# Patient Record
Sex: Male | Born: 1965 | Race: Black or African American | Hispanic: No | Marital: Single | Smoking: Never smoker
Health system: Southern US, Community
[De-identification: ages and names within clinical notes are randomized; demographics above are authoritative.]

## PROBLEM LIST (undated history)

## (undated) DIAGNOSIS — F79 Unspecified intellectual disabilities: Secondary | ICD-10-CM

## (undated) DIAGNOSIS — I10 Essential (primary) hypertension: Secondary | ICD-10-CM

---

## 1997-09-05 ENCOUNTER — Encounter: Admission: RE | Admit: 1997-09-05 | Discharge: 1997-09-05 | Payer: Self-pay | Admitting: Sports Medicine

## 1998-03-11 ENCOUNTER — Encounter: Admission: RE | Admit: 1998-03-11 | Discharge: 1998-03-11 | Payer: Self-pay | Admitting: Family Medicine

## 1998-03-26 ENCOUNTER — Encounter: Admission: RE | Admit: 1998-03-26 | Discharge: 1998-03-26 | Payer: Self-pay | Admitting: Family Medicine

## 1998-04-25 ENCOUNTER — Encounter: Admission: RE | Admit: 1998-04-25 | Discharge: 1998-04-25 | Payer: Self-pay | Admitting: Family Medicine

## 1999-04-20 ENCOUNTER — Encounter: Admission: RE | Admit: 1999-04-20 | Discharge: 1999-04-20 | Payer: Self-pay | Admitting: Family Medicine

## 1999-04-20 ENCOUNTER — Encounter: Admission: RE | Admit: 1999-04-20 | Discharge: 1999-04-20 | Payer: Self-pay | Admitting: *Deleted

## 1999-04-20 ENCOUNTER — Encounter: Payer: Self-pay | Admitting: *Deleted

## 1999-05-19 ENCOUNTER — Encounter: Admission: RE | Admit: 1999-05-19 | Discharge: 1999-05-19 | Payer: Self-pay | Admitting: Sports Medicine

## 1999-06-18 ENCOUNTER — Encounter: Admission: RE | Admit: 1999-06-18 | Discharge: 1999-06-18 | Payer: Self-pay | Admitting: Family Medicine

## 2000-02-09 ENCOUNTER — Encounter: Admission: RE | Admit: 2000-02-09 | Discharge: 2000-02-09 | Payer: Self-pay | Admitting: Family Medicine

## 2000-09-07 ENCOUNTER — Encounter: Admission: RE | Admit: 2000-09-07 | Discharge: 2000-09-07 | Payer: Self-pay | Admitting: Family Medicine

## 2001-08-24 ENCOUNTER — Encounter: Admission: RE | Admit: 2001-08-24 | Discharge: 2001-08-24 | Payer: Self-pay | Admitting: Sports Medicine

## 2001-10-25 ENCOUNTER — Encounter: Admission: RE | Admit: 2001-10-25 | Discharge: 2001-10-25 | Payer: Self-pay | Admitting: Family Medicine

## 2002-05-24 ENCOUNTER — Encounter: Admission: RE | Admit: 2002-05-24 | Discharge: 2002-05-24 | Payer: Self-pay | Admitting: Family Medicine

## 2002-06-01 ENCOUNTER — Encounter: Admission: RE | Admit: 2002-06-01 | Discharge: 2002-06-01 | Payer: Self-pay | Admitting: Family Medicine

## 2002-06-13 ENCOUNTER — Encounter: Admission: RE | Admit: 2002-06-13 | Discharge: 2002-06-13 | Payer: Self-pay | Admitting: Sports Medicine

## 2002-06-13 ENCOUNTER — Encounter: Payer: Self-pay | Admitting: Sports Medicine

## 2002-07-02 ENCOUNTER — Encounter: Admission: RE | Admit: 2002-07-02 | Discharge: 2002-07-02 | Payer: Self-pay | Admitting: Family Medicine

## 2002-11-22 ENCOUNTER — Encounter: Admission: RE | Admit: 2002-11-22 | Discharge: 2002-11-22 | Payer: Self-pay | Admitting: Family Medicine

## 2003-03-25 ENCOUNTER — Encounter: Admission: RE | Admit: 2003-03-25 | Discharge: 2003-03-25 | Payer: Self-pay | Admitting: Family Medicine

## 2003-09-24 ENCOUNTER — Encounter: Admission: RE | Admit: 2003-09-24 | Discharge: 2003-09-24 | Payer: Self-pay | Admitting: Family Medicine

## 2004-09-08 ENCOUNTER — Ambulatory Visit: Payer: Self-pay | Admitting: Family Medicine

## 2005-03-31 ENCOUNTER — Ambulatory Visit: Payer: Self-pay | Admitting: Family Medicine

## 2005-07-21 ENCOUNTER — Ambulatory Visit: Payer: Self-pay | Admitting: Family Medicine

## 2005-07-23 ENCOUNTER — Ambulatory Visit: Payer: Self-pay | Admitting: Family Medicine

## 2006-07-07 DIAGNOSIS — I10 Essential (primary) hypertension: Secondary | ICD-10-CM | POA: Insufficient documentation

## 2006-07-07 DIAGNOSIS — F79 Unspecified intellectual disabilities: Secondary | ICD-10-CM | POA: Insufficient documentation

## 2006-07-07 DIAGNOSIS — G44209 Tension-type headache, unspecified, not intractable: Secondary | ICD-10-CM

## 2006-07-27 ENCOUNTER — Ambulatory Visit: Payer: Self-pay | Admitting: Family Medicine

## 2006-07-27 ENCOUNTER — Encounter: Payer: Self-pay | Admitting: Family Medicine

## 2006-07-27 LAB — CONVERTED CEMR LAB
ALT: 34 units/L (ref 0–53)
AST: 17 units/L (ref 0–37)
Albumin: 4.3 g/dL (ref 3.5–5.2)
Alkaline Phosphatase: 89 units/L (ref 39–117)
BUN: 13 mg/dL (ref 6–23)
CO2: 25 meq/L (ref 19–32)
Calcium: 9.2 mg/dL (ref 8.4–10.5)
Chloride: 101 meq/L (ref 96–112)
Creatinine, Ser: 1.17 mg/dL (ref 0.40–1.50)
Glucose, Bld: 112 mg/dL — ABNORMAL HIGH (ref 70–99)
Potassium: 3.9 meq/L (ref 3.5–5.3)
Sodium: 139 meq/L (ref 135–145)
Total Bilirubin: 1.5 mg/dL — ABNORMAL HIGH (ref 0.3–1.2)
Total Protein: 8 g/dL (ref 6.0–8.3)

## 2006-12-07 ENCOUNTER — Encounter (INDEPENDENT_AMBULATORY_CARE_PROVIDER_SITE_OTHER): Payer: Self-pay | Admitting: Family Medicine

## 2007-03-05 ENCOUNTER — Emergency Department (HOSPITAL_COMMUNITY): Admission: EM | Admit: 2007-03-05 | Discharge: 2007-03-05 | Payer: Self-pay | Admitting: Emergency Medicine

## 2007-05-19 ENCOUNTER — Ambulatory Visit: Payer: Self-pay | Admitting: Family Medicine

## 2007-05-19 DIAGNOSIS — E669 Obesity, unspecified: Secondary | ICD-10-CM

## 2007-11-21 ENCOUNTER — Ambulatory Visit: Payer: Self-pay | Admitting: Family Medicine

## 2007-11-21 ENCOUNTER — Encounter: Payer: Self-pay | Admitting: Family Medicine

## 2007-11-21 DIAGNOSIS — B351 Tinea unguium: Secondary | ICD-10-CM

## 2007-11-21 LAB — CONVERTED CEMR LAB
ALT: 14 units/L (ref 0–53)
AST: 14 units/L (ref 0–37)
Albumin: 4.3 g/dL (ref 3.5–5.2)
Alkaline Phosphatase: 82 units/L (ref 39–117)
BUN: 16 mg/dL (ref 6–23)
CO2: 26 meq/L (ref 19–32)
Calcium: 9.6 mg/dL (ref 8.4–10.5)
Chloride: 102 meq/L (ref 96–112)
Creatinine, Ser: 1.09 mg/dL (ref 0.40–1.50)
Glucose, Bld: 105 mg/dL — ABNORMAL HIGH (ref 70–99)
Potassium: 3.8 meq/L (ref 3.5–5.3)
Sodium: 142 meq/L (ref 135–145)
Total Bilirubin: 1.3 mg/dL — ABNORMAL HIGH (ref 0.3–1.2)
Total Protein: 7.7 g/dL (ref 6.0–8.3)

## 2008-02-29 ENCOUNTER — Ambulatory Visit: Payer: Self-pay | Admitting: Family Medicine

## 2008-07-11 ENCOUNTER — Ambulatory Visit: Payer: Self-pay | Admitting: Family Medicine

## 2008-07-11 ENCOUNTER — Encounter: Payer: Self-pay | Admitting: Family Medicine

## 2008-07-11 DIAGNOSIS — R32 Unspecified urinary incontinence: Secondary | ICD-10-CM

## 2008-07-11 DIAGNOSIS — R159 Full incontinence of feces: Secondary | ICD-10-CM | POA: Insufficient documentation

## 2008-07-11 LAB — CONVERTED CEMR LAB
Bilirubin Urine: NEGATIVE
Blood in Urine, dipstick: NEGATIVE
Cholesterol: 166 mg/dL (ref 0–200)
Glucose, Urine, Semiquant: NEGATIVE
HDL: 32 mg/dL — ABNORMAL LOW (ref >39)
LDL Cholesterol: 119 mg/dL — ABNORMAL HIGH (ref 0–99)
Nitrite: NEGATIVE
Protein, U semiquant: 30
Specific Gravity, Urine: 1.025
Total CHOL/HDL Ratio: 5.2
Triglycerides: 76 mg/dL (ref <150)
Urobilinogen, UA: 0.2
VLDL: 15 mg/dL (ref 0–40)
WBC Urine, dipstick: NEGATIVE
pH: 6

## 2009-06-10 ENCOUNTER — Ambulatory Visit: Payer: Self-pay | Admitting: Family Medicine

## 2009-06-16 ENCOUNTER — Ambulatory Visit: Payer: Self-pay | Admitting: Family Medicine

## 2009-06-23 ENCOUNTER — Ambulatory Visit: Payer: Self-pay | Admitting: Family Medicine

## 2010-03-18 ENCOUNTER — Emergency Department (HOSPITAL_COMMUNITY): Admission: EM | Admit: 2010-03-18 | Discharge: 2010-03-18 | Payer: Self-pay | Admitting: Emergency Medicine

## 2010-03-20 ENCOUNTER — Encounter: Payer: Self-pay | Admitting: Family Medicine

## 2010-03-20 ENCOUNTER — Ambulatory Visit: Payer: Self-pay | Admitting: Family Medicine

## 2010-03-20 DIAGNOSIS — S8990XA Unspecified injury of unspecified lower leg, initial encounter: Secondary | ICD-10-CM | POA: Insufficient documentation

## 2010-03-20 DIAGNOSIS — S99919A Unspecified injury of unspecified ankle, initial encounter: Secondary | ICD-10-CM

## 2010-03-20 DIAGNOSIS — S99929A Unspecified injury of unspecified foot, initial encounter: Secondary | ICD-10-CM

## 2010-03-23 LAB — CONVERTED CEMR LAB
BUN: 10 mg/dL (ref 6–23)
CO2: 28 meq/L (ref 19–32)
Calcium: 8.7 mg/dL (ref 8.4–10.5)
Chloride: 105 meq/L (ref 96–112)
Creatinine, Ser: 0.99 mg/dL (ref 0.40–1.50)
Glucose, Bld: 132 mg/dL — ABNORMAL HIGH (ref 70–99)
Potassium: 3.7 meq/L (ref 3.5–5.3)
Sodium: 143 meq/L (ref 135–145)

## 2010-04-30 ENCOUNTER — Encounter: Payer: Self-pay | Admitting: Family Medicine

## 2010-06-04 ENCOUNTER — Telehealth: Payer: Self-pay | Admitting: Family Medicine

## 2010-06-06 ENCOUNTER — Encounter: Payer: Self-pay | Admitting: Family Medicine

## 2010-06-11 NOTE — Assessment & Plan Note (Signed)
Summary: BP CHECK/KH  Nurse Visit  In for BP check. BP checked  manually using large adult cuff in left arm. BP 120/90 pulse 92. spoke with caregiver , Erie Noe , and she verifies that patient has been taking medication as directed. advised to return in one week to recheck BP again as MD had wanted him to come back in 2 weeks after last office visit. she voices understanding. will forward reading today to MD and ask if any different instructions at this time. call back for Erie Noe [phone]. Theresia Lo RN  June 16, 2009 2:45 PM   Allergies: No Known Drug Allergies  Orders Added: 1)  No Charge Patient Arrived (NCPA0) [NCPA0] due to maternity leave Dr. Georgiana Shore did not get this message . patient returned on 06/23/2009 for BP check . see note. consulted with Dr. Deirdre Priest. Theresia Lo RN  June 23, 2009 11:07 AM

## 2010-06-11 NOTE — Assessment & Plan Note (Signed)
Summary: bp check/eo  Nurse Visit In for BP check today. BP LA120/100 , RA 120/104, pulse 92. caregiver states patient has not had BP med today yet.  caregiver was rushing to get here with another patient and brought [place] with her but did forget to give med today. Consulted Dr. Deirdre Priest and he advises to have patient return in one week again and be sure to take med  at usual time before BP check. Caregiver will scheduled appointment.  Allergies: No Known Drug Allergies  Orders Added: 1)  No Charge Patient Arrived (NCPA0) [NCPA0]
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
## 2010-06-11 NOTE — Miscellaneous (Signed)
  Clinical Lists Changes  Problems: Removed problem of HEALTH MAINTENANCE EXAM (ICD-V70.0) Removed problem of SCREENING FOR LIPOID DISORDERS (ICD-V77.91) Removed problem of HYPERTENSION, BENIGN ESSENTIAL (ICD-401.1)

## 2010-06-11 NOTE — Assessment & Plan Note (Signed)
Summary: congestion,tcb   Vital Signs:  Patient profile:   45 year old male Height:      69 inches Weight:      195.6 pounds BMI:     28.99 Temp:     98.8 degrees F oral Pulse rate:   80 / minute BP sitting:   121 / 84  (left arm) Cuff size:   regular  Vitals Entered By: Gladstone Pih (June 10, 2009 10:04 AM) CC: bp Is Patient Diabetic? No Pain Assessment Patient in pain? no        Primary Care Provider:  Ardeen Garland  MD  CC:  bp.  History of Present Illness: Here for routine check-up follow up.  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  needs form filled out.  Discussed:  1.  bp--slightly elevated diastolic bp at most of visits in recent past.  today on manual recheck, dbp is 92.  has been as high as 100.  on metop 50 two times a day and hctz 25.    Habits & Providers  Alcohol-Tobacco-Diet     Tobacco Status: never  Current Medications (verified): 1)  Hydrochlorothiazide 25 Mg  Tabs (Hydrochlorothiazide) .Marland Kitchen.. 1 Tablet Daily For High Blood Pressure 2)  Metoprolol Tartrate 25 Mg Tabs (Metoprolol Tartrate) .Marland Kitchen.. 1 Tab By Mouth Two Times A Day  Allergies: No Known Drug Allergies  Past History:  Past Medical History:  microscopic hematuria (assoc w/ renal stone), renal stone, Walks with limp HTN mild-moderate MR  Past Surgical History: Reviewed history from 07/07/2006 and no changes required. CT - Head   normal - 04/10/1999, xray - abdomen: 5mm right renal stone - 06/18/2002  Family History: Reviewed history from 05/19/2007 and no changes required. Patient is mentally retarded, and his family history is unavailable. His case manager in group home has been queried about any additional history.  Social History: Reviewed history from 07/11/2008 and no changes required. Denies EtoH, tob, drugs.  Works at [employer].  Resident of group home.  Sister is legal guardian and manages his finances.  Review of  Systems  The patient denies chest pain.   CV:  Denies shortness of breath with exertion and swelling of feet.  Physical Exam  General:  MR middle aged male pleasant, answers questions appropriately, follows commands appropriately. Lungs:  Normal respiratory effort, chest expands symmetrically. Lungs are clear to auscultation, no crackles or wheezes. Heart:  Normal rate and regular rhythm. S1 and S2 normal without gallop, murmur, click, rub or other extra sounds. PMI normal.   Extremities:  no LE edema Additional Exam:  vital signs reviewed    Impression & Recommendations:  Problem # 1:  HYPERTENSION, BENIGN SYSTEMIC (ICD-401.1) Assessment Unchanged  check labs.  he is young man, and would like to see dbp a little lower.  increase metop.  nurse visit for bp recheck in 2 weeks.  check bmet today His updated medication list for this problem includes:    Hydrochlorothiazide 25 Mg Tabs (Hydrochlorothiazide) .Marland Kitchen... 1 tablet daily for high blood pressure    Metoprolol Tartrate 100 Mg Tabs (Metoprolol tartrate) .Marland Kitchen... 1 tab by mouth two times a day for blood pressure  Orders: [facility] ([address])  Complete Medication List: 1)  Hydrochlorothiazide 25 Mg Tabs (Hydrochlorothiazide) .Marland Kitchen.. 1 tablet daily for high blood pressure 2)  Metoprolol Tartrate 100 Mg Tabs (Metoprolol tartrate) .Marland Kitchen.. 1 tab by mouth two times a day for blood pressure  Other Orders:  [facility]- Est Level  3 ([address])   Patient Instructions: 1)  It was nice to see you today. 2)  I increased your metoprolol for blood pressure.  3)  Make an appointment for a  nurse visit for a blood pressure check in the next 3 weeks. 4)  Please schedule a follow-up appointment in 1 year.  Prescriptions: METOPROLOL TARTRATE 100 MG TABS (METOPROLOL TARTRATE) 1 tab by mouth two times a day for blood pressure  #60 x 6   Entered by:   Asher Muir MD   Authorized by:   Marisue Ivan  MD   Signed by:   Asher Muir MD on  06/10/2009   Method used:   Electronically to        [facility]* (retail)       [address]       [place], [place]  [id number]       Ph: [phone]       Fax: [phone]   RxID:   [id number]   Prevention & Chronic Care Immunizations   Influenza vaccine: Fluvax 3+  (05/19/2007)   Influenza vaccine due: 05/18/2008    Tetanus booster: 07/11/2008: given   Tetanus booster due: 07/12/2018    Pneumococcal vaccine: Not documented  Other Screening   Smoking status: never  (06/10/2009)  Lipids   Total Cholesterol: 166  (07/11/2008)   LDL: 119  (07/11/2008)   LDL Direct: Not documented   HDL: 32  (07/11/2008)   Triglycerides: 76  (07/11/2008)  Hypertension   Last Blood Pressure: 121 / 84  (06/10/2009)   Serum creatinine: 1.09  (11/21/2007)   Serum potassium 3.8  (11/21/2007)    Hypertension flowsheet reviewed?: Yes   Progress toward BP goal: At goal  Self-Management Support :   Personal Goals (by the next clinic visit) :      Personal blood pressure goal: 140/90  (06/10/2009)   Hypertension self-management support: Not documented

## 2010-06-11 NOTE — Letter (Signed)
Summary: Generic Letter  [facility]  [address]   [place], [place] [address]   Phone: [phone]  Fax: [phone]    06/06/2010  [name] [address] [place], [place]  [address]  To whom it may concern,  Please discontinue any narcotic medication that Brendan Abbott is using. Including hydrocodone/acetominaphen. If there are any questions, please call the [facility].           Sincerely,   Doree Albee MD

## 2010-06-11 NOTE — Progress Notes (Signed)
Summary: Medication Letter  Phone Note Other Incoming Call back at [phone]   Caller: [facility] Summary of Call: Need order to discontinue taking Hydrocodone 5-325 mgs.   Initial call taken by: Abundio Miu,  June 04, 2010 2:51 PM  Follow-up for Phone Call        Recieved call from cargiver stating that pt recieved short term course of vicodin from [facility]. 9-10/11. Was seen at followup in [facility] 03/2010. Pt has completed course of medication. Caregiver is asking for formal letter tating to discontinue vicodin. Discussed with caregiver that I never filled vicodin. Pt has completed medication. Has no refills. Caregiver states that state law requires written letter  that medication os discontiued. Told caregiver that while have not filled rx, I will give letter about medication. Caregiver agreeable.  Doree Albee MD June 06, 2010 7:24 PM

## 2010-07-02 ENCOUNTER — Other Ambulatory Visit: Payer: Self-pay | Admitting: Family Medicine

## 2010-07-02 MED ORDER — METOPROLOL TARTRATE 100 MG PO TABS: 100.0000 mg | ORAL_TABLET | Freq: Two times a day (BID) | ORAL | Status: DC

## 2010-07-08 ENCOUNTER — Ambulatory Visit (INDEPENDENT_AMBULATORY_CARE_PROVIDER_SITE_OTHER): Payer: Medicare Other | Admitting: Family Medicine

## 2010-07-08 ENCOUNTER — Encounter: Payer: Self-pay | Admitting: Family Medicine

## 2010-07-08 VITALS — BP 131/80 | HR 88 | Temp 98.2°F | Ht 68.25 in | Wt 182.2 lb

## 2010-07-08 DIAGNOSIS — E669 Obesity, unspecified: Secondary | ICD-10-CM

## 2010-07-08 DIAGNOSIS — I10 Essential (primary) hypertension: Secondary | ICD-10-CM

## 2010-07-08 MED ORDER — HYDROCHLOROTHIAZIDE 25 MG PO TABS: 25.0000 mg | ORAL_TABLET | Freq: Every day | ORAL | Status: DC

## 2010-07-08 MED ORDER — LISINOPRIL 10 MG PO TABS: 10.0000 mg | ORAL_TABLET | Freq: Every day | ORAL | Status: DC

## 2010-07-08 MED ORDER — METOPROLOL TARTRATE 100 MG PO TABS: ORAL_TABLET | ORAL | Status: DC

## 2010-07-08 MED ORDER — ASPIRIN 81 MG PO CHEW: 81.0000 mg | CHEWABLE_TABLET | Freq: Every day | ORAL | Status: AC

## 2010-07-08 NOTE — Patient Instructions (Addendum)
Hypertension (High Blood Pressure)   As your heart beats, it forces blood through your arteries. This force is your blood pressure. If the pressure is too high, it is called hypertension (HTN) or high blood pressure. HTN is dangerous because you may have it and not know it. High blood pressure may mean that your heart has to work harder to pump blood. Your arteries may be narrow or stiff. The extra work puts you at risk for heart disease, stroke, and other problems.    Blood pressure consists of two numbers, a higher number over a lower, 110/72, for example. It is stated as "110 over 72." The ideal is below 120 for the top number (systolic) and under 80 for the bottom (diastolic).   You should pay close attention to your blood pressure if you have certain conditions such as:  Heart failure.  Prior heart attack.   Diabetes  Chronic kidney disease.   Prior stroke.  Multiple risk factors for heart disease.    To see if you have HTN, your blood pressure should be measured while you are seated with your arm held at the level of the heart. It should be measured at least twice. A one-time elevated blood pressure reading (especially in the Emergency Department) does not mean that you need treatment. There may be conditions in which the blood pressure is different between your right and left arms. It is important to see your caregiver soon for a recheck.   Most people have essential hypertension which means that there is not a specific cause. This type of high blood pressure may be lowered by changing lifestyle factors such as:  Stress.  Smoking.  Lack of exercise.  Excessive weight.  Drug/tobacco/alcohol use.   Eating less salt.     Most people do not have symptoms from high blood pressure until it has caused damage to the body. Effective treatment can often prevent, delay or reduce that damage.   TREATMENT: Treatment for high blood pressure, when a cause has been identified, is directed  at the cause. There are a large number of medications to treat HTN. These fall into several categories, and your caregiver will help you select the medicines that are best for you. Medications may have side effects. You should review side effects with your caregiver.   If your blood pressure stays high after you have made lifestyle changes or started on medicines,   Your medication(s) may need to be changed.  Other problems may need to be addressed.  Be certain you understand your prescriptions, and know how and when to take your medicine.  Be sure to follow up with your caregiver within the time frame advised (usually within two weeks) to have your blood pressure rechecked and to review your medications.  If you are taking more than one medicine to lower your blood pressure, make sure you know how and at what times they should be taken. Taking two medicines at the same time can result in blood pressure that is too low.   SEEK IMMEDIATE MEDICAL CARE IF YOU DEVELOP:  A severe headache, blurred or changing vision, or confusion.  Unusual weakness or numbness, or a faint feeling.  Severe chest or abdominal pain, vomiting, or breathing problems.   It is your responsibility to follow up with your caregiver in order to determine if your elevated blood pressure should be treated.     MAKE SURE YOU:   Understand these instructions.   Will watch your condition.  Will get help right away if you are not doing well or get worse.   Document Released: 10/07/2005  Document Re-Released: 07/23/2008 [facility] Patient Information 2011 [place], [place]. New Doctor's Orders: Start taking aspirin 81mg  daily Take 1 1/2 tablets of metoprolol 100mg  twice daily Continue with you previous medication dosing Take blood pressure once-twice daily Doree Albee MD

## 2010-07-09 ENCOUNTER — Other Ambulatory Visit: Payer: Medicare Other

## 2010-07-13 NOTE — Progress Notes (Signed)
  Subjective:    Patient ID: LINFORD QUINTELA, male    DOB: [birth year], 45 y.o.   MRN: [id number]  Hypertension This is a chronic problem. The current episode started more than 1 year ago. The problem is unchanged. The problem is controlled. Pertinent negatives include no blurred vision, chest pain, headaches, palpitations or peripheral edema. There are no associated agents to hypertension. Risk factors for coronary artery disease include male gender. Past treatments include beta blockers and diuretics. Compliance problems include diet (Caregiver reports patient eating lower sodium diet as pt  is being fed stricter diet).  Hypertensive end-organ damage includes CAD/MI.      Review of Systems  Eyes: Negative for blurred vision.  Cardiovascular: Negative for chest pain and palpitations.  Neurological: Negative for headaches.       Objective:   Physical Exam  Constitutional: He appears well-developed.  HENT:  Head: Normocephalic and atraumatic.  Eyes: Conjunctivae are normal. Pupils are equal, round, and reactive to light. No scleral icterus.  Neck: Normal range of motion. Neck supple.  Cardiovascular: Normal rate, regular rhythm and normal heart sounds.   Pulmonary/Chest: Effort normal and breath sounds normal.  Abdominal: Soft. Bowel sounds are normal.  Neurological:       Responsive to some commands. Baseline MR stable  Psychiatric:       Mood, affect, behavior at baseline          Assessment & Plan:

## 2010-07-13 NOTE — Assessment & Plan Note (Addendum)
Weight trending down with physical activity. BMI now 27.5. Will continue to follow.

## 2010-07-13 NOTE — Assessment & Plan Note (Addendum)
Otherwise normal follow up. Will increase metoprolol by 50% given baseline HR in upper 80s. Will otherwise follow up  In 6 months. Encouraged continued exercise at living facility as well as low sodium diet. Plan to also check lipids today. Caregiver agreeable.

## 2010-07-31 ENCOUNTER — Ambulatory Visit: Payer: Self-pay | Admitting: Family Medicine

## 2010-08-04 ENCOUNTER — Other Ambulatory Visit: Payer: Medicare Other

## 2010-08-04 DIAGNOSIS — I10 Essential (primary) hypertension: Secondary | ICD-10-CM

## 2010-08-04 LAB — COMPREHENSIVE METABOLIC PANEL
ALT: <8 U/L (ref 0–53)
AST: 12 U/L (ref 0–37)
Calcium: 9.5 mg/dL (ref 8.4–10.5)
Chloride: 102 mEq/L (ref 96–112)
Creat: 1.07 mg/dL (ref 0.40–1.50)
Sodium: 141 mEq/L (ref 135–145)
Total Bilirubin: 1.6 mg/dL — ABNORMAL HIGH (ref 0.3–1.2)
Total Protein: 7.6 g/dL (ref 6.0–8.3)

## 2010-08-04 LAB — LIPID PANEL
LDL Cholesterol: 81 mg/dL (ref 0–99)
Triglycerides: 137 mg/dL (ref <150)
VLDL: 27 mg/dL (ref 0–40)

## 2010-08-04 NOTE — Progress Notes (Signed)
Drew pt for CMP and fasting lipid. 1sst. AC

## 2010-08-10 ENCOUNTER — Ambulatory Visit: Payer: Medicare Other | Admitting: Family Medicine

## 2010-08-10 ENCOUNTER — Encounter: Payer: Self-pay | Admitting: Family Medicine

## 2010-08-10 ENCOUNTER — Ambulatory Visit (INDEPENDENT_AMBULATORY_CARE_PROVIDER_SITE_OTHER): Payer: Medicare Other | Admitting: Family Medicine

## 2010-08-10 VITALS — BP 117/82 | HR 74 | Temp 98.1°F | Wt 184.0 lb

## 2010-08-10 DIAGNOSIS — I10 Essential (primary) hypertension: Secondary | ICD-10-CM

## 2010-08-17 NOTE — Progress Notes (Signed)
Pt scheduled for follow up appt in error. Will follow up in 6 months as previously scheduled.  Subjective:    Patient ID: Brendan Abbott, male    DOB: 04-07-1966, 45 y.o.   MRN: [id number]  HPI    Review of Systems     Objective:   Physical Exam        Assessment & Plan:  Pt scheduled for follow up appt in error. Will follow up in 6 months as previously scheduled.

## 2010-10-23 ENCOUNTER — Telehealth: Payer: Self-pay | Admitting: Family Medicine

## 2010-10-23 NOTE — Telephone Encounter (Signed)
Form placed in MD box.

## 2010-10-23 NOTE — Telephone Encounter (Signed)
Patients caregiver dropped off forms to be filled out for new care facility/  Please call her when completed.

## 2011-02-11 ENCOUNTER — Telehealth: Payer: Self-pay | Admitting: Family Medicine

## 2011-02-11 NOTE — Telephone Encounter (Signed)
Patients caregiver dropped off CPS form to be completed, given to D. Loring for any clinical completion.

## 2011-02-11 NOTE — Telephone Encounter (Signed)
PCS form placed in Dr. Roscoe Blas box for completion.  [name]

## 2011-02-15 NOTE — Telephone Encounter (Signed)
PCS form completed.  Jasmine December notified form is ready to be picked up at front desk.  [name]

## 2011-03-02 ENCOUNTER — Telehealth: Payer: Self-pay | Admitting: *Deleted

## 2011-03-02 DIAGNOSIS — I10 Essential (primary) hypertension: Secondary | ICD-10-CM

## 2011-03-02 MED ORDER — METOPROLOL TARTRATE 100 MG PO TABS: ORAL_TABLET | ORAL | Status: DC

## 2011-03-16 ENCOUNTER — Other Ambulatory Visit: Payer: Self-pay | Admitting: Family Medicine

## 2011-03-16 DIAGNOSIS — I10 Essential (primary) hypertension: Secondary | ICD-10-CM

## 2011-03-16 MED ORDER — LISINOPRIL 10 MG PO TABS: 10.0000 mg | ORAL_TABLET | Freq: Every day | ORAL | Status: DC

## 2011-03-16 MED ORDER — HYDROCHLOROTHIAZIDE 25 MG PO TABS: 25.0000 mg | ORAL_TABLET | Freq: Every day | ORAL | Status: DC

## 2011-03-29 ENCOUNTER — Other Ambulatory Visit: Payer: Self-pay | Admitting: Family Medicine

## 2011-03-29 DIAGNOSIS — I10 Essential (primary) hypertension: Secondary | ICD-10-CM

## 2011-03-29 MED ORDER — HYDROCHLOROTHIAZIDE 25 MG PO TABS: 25.0000 mg | ORAL_TABLET | Freq: Every day | ORAL | Status: DC

## 2011-03-29 MED ORDER — METOPROLOL TARTRATE 100 MG PO TABS: ORAL_TABLET | ORAL | Status: DC

## 2011-04-27 ENCOUNTER — Other Ambulatory Visit: Payer: Self-pay | Admitting: Family Medicine

## 2011-04-27 NOTE — Telephone Encounter (Signed)
Refill request

## 2011-06-11 ENCOUNTER — Telehealth: Payer: Self-pay | Admitting: *Deleted

## 2011-06-11 NOTE — Telephone Encounter (Signed)
Caregiver dropped off FL2 form to be completed by MD, please call her when ready to be picked up. 

## 2011-06-11 NOTE — Telephone Encounter (Signed)
FL-2 placed in Dr. Silex Blas box for signature.  Brendan Abbott

## 2011-06-16 NOTE — Telephone Encounter (Signed)
FL-2 and CCME for [facility] forms completed.  Kelli Churn notified forms are ready to be picked up at front desk.  [name]

## 2011-09-13 ENCOUNTER — Other Ambulatory Visit: Payer: Self-pay | Admitting: Family Medicine

## 2011-11-03 ENCOUNTER — Other Ambulatory Visit: Payer: Self-pay | Admitting: Family Medicine

## 2011-11-09 ENCOUNTER — Other Ambulatory Visit: Payer: Self-pay | Admitting: Family Medicine

## 2011-11-22 ENCOUNTER — Encounter: Payer: Self-pay | Admitting: Family Medicine

## 2011-11-22 ENCOUNTER — Ambulatory Visit (INDEPENDENT_AMBULATORY_CARE_PROVIDER_SITE_OTHER): Payer: Medicare Other | Admitting: Family Medicine

## 2011-11-22 VITALS — BP 120/79 | HR 84 | Ht 68.25 in | Wt 198.0 lb

## 2011-11-22 DIAGNOSIS — E669 Obesity, unspecified: Secondary | ICD-10-CM

## 2011-11-22 DIAGNOSIS — F79 Unspecified intellectual disabilities: Secondary | ICD-10-CM

## 2011-11-22 DIAGNOSIS — I10 Essential (primary) hypertension: Secondary | ICD-10-CM

## 2011-11-24 NOTE — Progress Notes (Signed)
  Subjective:    Patient ID: Brendan Abbott, male    DOB: 09-07-1965, 46 y.o.   MRN: [id number]  HPI 1. HTN:  Compliant with medications.  Reports no problems.  Care giver at group home checks BP occasionally.  Denies chest pain, headache, palpitations, lightheadedness.  2. Mental retardation:  Reports no new problems.  Needs paperwork for group home completed and letter for day program.   3. Obesity:  Weight has increased some over the past year.  No changes in diet.  No regular exercise.  Lipid panel in the past has been normal.     Review of Systems Per HPI    Objective:   Physical Exam  Constitutional: No distress.       Quiet, mentally retarded   Cardiovascular: Normal rate and regular rhythm.   Pulmonary/Chest: Effort normal and breath sounds normal.  Musculoskeletal: He exhibits no edema.          Assessment & Plan:

## 2011-11-24 NOTE — Assessment & Plan Note (Signed)
Discussed limiting fats and carbs in diet.  Walking as tolerated for exercise.

## 2011-11-24 NOTE — Assessment & Plan Note (Signed)
Completed paperwork and wrote letter for group home.

## 2011-11-24 NOTE — Assessment & Plan Note (Signed)
Pressures well controlled, continue current medications.

## 2011-12-14 ENCOUNTER — Other Ambulatory Visit: Payer: Self-pay | Admitting: *Deleted

## 2012-01-19 ENCOUNTER — Other Ambulatory Visit: Payer: Self-pay | Admitting: Family Medicine

## 2012-01-19 MED ORDER — LISINOPRIL 10 MG PO TABS: 10.0000 mg | ORAL_TABLET | Freq: Every day | ORAL | Status: DC

## 2012-05-16 ENCOUNTER — Other Ambulatory Visit: Payer: Self-pay | Admitting: *Deleted

## 2012-05-16 MED ORDER — METOPROLOL TARTRATE 100 MG PO TABS: 150.0000 mg | ORAL_TABLET | Freq: Two times a day (BID) | ORAL | Status: DC

## 2012-06-08 ENCOUNTER — Telehealth: Payer: Self-pay | Admitting: Family Medicine

## 2012-06-08 NOTE — Telephone Encounter (Signed)
Caregiver dropped off forms to be filled out.  Please call when completed.

## 2012-06-13 NOTE — Telephone Encounter (Signed)
PCS form completed by Dr. Ashley Royalty. Ms.Liggins notified form is ready to be picked up at front desk.   JAS

## 2012-07-06 ENCOUNTER — Ambulatory Visit (INDEPENDENT_AMBULATORY_CARE_PROVIDER_SITE_OTHER): Payer: Medicare Other | Admitting: Family Medicine

## 2012-07-06 ENCOUNTER — Encounter: Payer: Self-pay | Admitting: Family Medicine

## 2012-07-06 VITALS — BP 118/80 | HR 81 | Ht 68.25 in | Wt 205.0 lb

## 2012-07-06 DIAGNOSIS — E669 Obesity, unspecified: Secondary | ICD-10-CM | POA: Diagnosis not present

## 2012-07-06 DIAGNOSIS — I10 Essential (primary) hypertension: Secondary | ICD-10-CM | POA: Diagnosis not present

## 2012-07-06 LAB — CBC
Hemoglobin: 15.5 g/dL (ref 13.0–17.0)
MCH: 25.1 pg — ABNORMAL LOW (ref 26.0–34.0)
RBC: 6.17 MIL/uL — ABNORMAL HIGH (ref 4.22–5.81)

## 2012-07-06 LAB — COMPREHENSIVE METABOLIC PANEL
Albumin: 4.1 g/dL (ref 3.5–5.2)
Alkaline Phosphatase: 81 U/L (ref 39–117)
BUN: 17 mg/dL (ref 6–23)
Glucose, Bld: 119 mg/dL — ABNORMAL HIGH (ref 70–99)
Potassium: 3.6 mEq/L (ref 3.5–5.3)

## 2012-07-06 LAB — LIPID PANEL
Cholesterol: 152 mg/dL (ref 0–200)
HDL: 26 mg/dL — ABNORMAL LOW (ref >39)
Total CHOL/HDL Ratio: 5.8 Ratio
Triglycerides: 210 mg/dL — ABNORMAL HIGH (ref <150)

## 2012-07-06 NOTE — Patient Instructions (Signed)
Thank you for coming in today, it was good to see you Everything looks great today.   I am checking some lab work, you can expect to hear something in the next week. Follow up with me in 6 months or as needed If you need any paperwork completed please drop off at the front dest.

## 2012-07-07 ENCOUNTER — Other Ambulatory Visit: Payer: Self-pay | Admitting: *Deleted

## 2012-07-07 MED ORDER — HYDROCHLOROTHIAZIDE 25 MG PO TABS: ORAL_TABLET | ORAL | Status: DC

## 2012-07-16 NOTE — Assessment & Plan Note (Signed)
BP well controlled, will check lab work today.  No changes to medications.

## 2012-07-16 NOTE — Progress Notes (Signed)
  Subjective:    Patient ID: Brendan Abbott, male    DOB: Sep 30, 1965, 47 y.o.   MRN: [id number]  HPI Has recently changed to [facility] of Care for home medical services and wanted to have everything checked out.   1. HTN: CHRONIC HYPERTENSION  Disease Monitoring  Blood pressure range: BP has been in good range from [facility] provider, but does not remember specific numbers  Chest pain: no   Dyspnea: no   Claudication: no   Medication compliance: yes  Medication Side Effects  Lightheadedness: no   Urinary frequency: no   Edema: no     2. Obesity:   Walking some for exercise.  Has meals prepared for him and does not do any cooking on his own.  He is not a picky eater and typically eats a variety of foods.    Review of Systems     Objective:   Physical Exam  Constitutional: He appears well-nourished. No distress.  HENT:  Head: Normocephalic and atraumatic.  Cardiovascular: Normal rate and regular rhythm.   Pulmonary/Chest: Effort normal and breath sounds normal.  Musculoskeletal: He exhibits no edema.          Assessment & Plan:

## 2012-07-16 NOTE — Assessment & Plan Note (Signed)
Weight stable.  Walking some for exercise.  Will check lipids today

## 2012-08-09 ENCOUNTER — Telehealth: Payer: Self-pay | Admitting: Family Medicine

## 2012-08-09 NOTE — Telephone Encounter (Signed)
Form dropped off by caregiver to be filled out for personal care.  Please call her when completed.

## 2012-08-09 NOTE — Telephone Encounter (Signed)
PCS form placed in Dr. Anastasio Auerbach box for completion.  [name]

## 2012-08-14 ENCOUNTER — Other Ambulatory Visit: Payer: Self-pay | Admitting: *Deleted

## 2012-08-14 MED ORDER — LISINOPRIL 10 MG PO TABS: 10.0000 mg | ORAL_TABLET | Freq: Every day | ORAL | Status: DC

## 2012-08-15 NOTE — Telephone Encounter (Signed)
Completed and returned to Lavetta Nielsen, DO

## 2012-08-15 NOTE — Telephone Encounter (Signed)
Ms. Brendan Abbott notified that PCS form is completed and ready to be picked up at front desk.  Brendan Abbott

## 2012-08-21 ENCOUNTER — Other Ambulatory Visit: Payer: Self-pay | Admitting: *Deleted

## 2012-08-21 MED ORDER — METOPROLOL TARTRATE 100 MG PO TABS: 150.0000 mg | ORAL_TABLET | Freq: Two times a day (BID) | ORAL | Status: DC

## 2012-08-21 MED ORDER — ASPIRIN 81 MG PO CHEW: 81.0000 mg | CHEWABLE_TABLET | Freq: Every day | ORAL | Status: DC

## 2012-10-16 ENCOUNTER — Ambulatory Visit: Payer: Medicare Other | Admitting: Family Medicine

## 2012-10-18 ENCOUNTER — Ambulatory Visit (INDEPENDENT_AMBULATORY_CARE_PROVIDER_SITE_OTHER): Payer: Medicare Other | Admitting: Family Medicine

## 2012-10-18 ENCOUNTER — Encounter: Payer: Self-pay | Admitting: Family Medicine

## 2012-10-18 VITALS — BP 114/65 | HR 85 | Temp 99.4°F | Wt 197.6 lb

## 2012-10-18 DIAGNOSIS — B351 Tinea unguium: Secondary | ICD-10-CM | POA: Diagnosis not present

## 2012-10-18 MED ORDER — TERBINAFINE HCL 250 MG PO TABS: 250.0000 mg | ORAL_TABLET | Freq: Every day | ORAL | Status: DC

## 2012-10-18 NOTE — Patient Instructions (Signed)
I want you to take the medication terbinafine once daily for the next 3 months. This should also treat your athlete's foot.

## 2012-10-24 NOTE — Progress Notes (Signed)
Patient ID: Brendan Abbott, male   DOB: Apr 02, 1966, 47 y.o.   MRN: [id number] Subjective: The patient is a 47 y.o. year old male who presents today for athlete's foot.  Present now for about 1 week.  Have not tried any otc treatments (not allowed to without doctor's order).  Also is having recurrence of nail fungus.  Was treated with orals a number o fyears ago (5-10) and was free of this for a long time afterwards.  This is an issue for them due to nail care.  Patient's past medical, social, and family history were reviewed and updated as appropriate. History  Substance Use Topics  . Smoking status: Never Smoker   . Smokeless tobacco: Not on file  . Alcohol Use: Not on file   Objective:  Filed Vitals:   10/18/12 1651  BP: 114/65  Pulse: 85  Temp: 99.4 F (37.4 C)   Gen: NAD Feet: Multiple nails with evidence of onchomycosis.  Area between great and first toes shows some skin scaling but no discharge/drainage.  No pain on palpation.  Assessment/Plan:  Please also see individual problems in problem list for problem-specific plans.

## 2012-10-24 NOTE — Assessment & Plan Note (Signed)
Terbinafine x3 months, this will treat athletes foot as well.

## 2012-11-07 ENCOUNTER — Telehealth: Payer: Self-pay | Admitting: Family Medicine

## 2012-11-07 NOTE — Telephone Encounter (Signed)
Provider dropped off form to be filled out.  Please fax when completed.

## 2012-11-07 NOTE — Telephone Encounter (Signed)
PCS form placed in Dr. Timothy Lasso box for signature.  Brendan Abbott

## 2012-11-09 NOTE — Telephone Encounter (Signed)
PCS form faxed to [phone] and [phone].  [name]

## 2012-11-09 NOTE — Telephone Encounter (Signed)
Form completed and returned to [place]. Thanks! --CMS

## 2012-11-15 ENCOUNTER — Other Ambulatory Visit: Payer: Self-pay | Admitting: *Deleted

## 2012-11-15 MED ORDER — METOPROLOL TARTRATE 100 MG PO TABS: 150.0000 mg | ORAL_TABLET | Freq: Two times a day (BID) | ORAL | Status: DC

## 2012-12-12 ENCOUNTER — Encounter: Payer: Self-pay | Admitting: Family Medicine

## 2012-12-12 ENCOUNTER — Ambulatory Visit (INDEPENDENT_AMBULATORY_CARE_PROVIDER_SITE_OTHER): Payer: Medicare Other | Admitting: Family Medicine

## 2012-12-12 VITALS — BP 120/73 | HR 78 | Temp 98.2°F | Ht 69.0 in | Wt 192.0 lb

## 2012-12-12 DIAGNOSIS — F79 Unspecified intellectual disabilities: Secondary | ICD-10-CM | POA: Diagnosis not present

## 2012-12-12 DIAGNOSIS — I10 Essential (primary) hypertension: Secondary | ICD-10-CM

## 2012-12-12 DIAGNOSIS — E669 Obesity, unspecified: Secondary | ICD-10-CM

## 2012-12-12 DIAGNOSIS — B351 Tinea unguium: Secondary | ICD-10-CM | POA: Diagnosis not present

## 2012-12-12 NOTE — Assessment & Plan Note (Signed)
Well-controlled on lisinopril 10, HCTZ 25, metoprolol 150 BID. Continue current meds. Routine f/u.

## 2012-12-12 NOTE — Progress Notes (Signed)
  Subjective:    Patient ID: Brendan Abbott, male    DOB: April 29, 1966, 47 y.o.   MRN: [id number]  HPI: Pt is brought in by caregiver [facility], group home for MR; pt is a resident there. He comes in for yearly check-up.  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 No reported difficulty with behaviors per group home caregiver.  -Does not see any specialist providers  HTN - On medications, daily ASA, HCTZ, lisinopril, metoprolol. Does not have problems with refusal.  -No complaints of headache, chest pain, SOB, no reported leg swelling  Obesity - BMI of 28, down from 30; eats a variety of foods, not a very picky eater  -walks occasionally for exercise  -weight down since February, but no precipitous or unintentional weight loss  Onychomycosis - on both feet, diagnosed in June 2014 by Dr. Louanne Belton  -started on terbinafine, has been compliant for about 6-8 weeks  -no side effects or difficulties; pt thinks his feet have been better since starting meds  Pt is a never smoker. In addition to the above documentation, pt's PMH, surgical history, FH, and SH all reviewed and updated where appropriate in the EMR. I have also reviewed and updated the pt's allergies and current medications as appropriate.  Review of Systems: As above. Otherwise, full 12-system ROS was reviewed and all negative.     Objective:   Physical Exam BP 120/73  Pulse 78  Temp(Src) 98.2 F (36.8 C) (Oral)  Ht 5\' 9"  (1.753 m)  Wt 192 lb (87.091 kg)  BMI 28.34 kg/m2 Gen: well-appearing male in NAD; answers simple yes/no questions appropriately HEENT: [place]/AT, sclerae/conjunctivae clear, no lid lag, EOMI, PERRLA   MMM, posterior oropharynx clear, no cervical lymphadenopathy  neck supple with full ROM, no masses appreciated; thyroid not enlarged  Cardio: RRR, no murmur appreciated; distal pulses intact/symmetric Pulm: CTAB, no wheezes, normal WOB  Abd: soft, nondistended, BS+, no HSM Ext: warm/well-perfused, no  cyanosis/clubbing/edema  Bilateral toes with some thickening, cracking, consistent with onychomycosis  Per caregiver, some improvement MSK: strength 5/5 in all four extremities, no frank joint deformity/effusion;   normal ROM to all four extremities with no point muscle/bony tenderness in spine Neuro/Psych: baseline mental status per caregiver; alert, responds appropriately;  normal gait/balance; mood euthymic with congruent affect     Assessment & Plan:

## 2012-12-12 NOTE — Assessment & Plan Note (Signed)
BMI improved from last 6 months, around 28 down from 30 (down 13 lb since February). Walks for exercise. Monitor weights at f/u.

## 2012-12-12 NOTE — Assessment & Plan Note (Addendum)
At baseline without behavioral problems at current group home. Not on any psychotropic meds. No changes to medications today. Completed FL-2 form and group home care plan form for this year.

## 2012-12-12 NOTE — Patient Instructions (Signed)
Thank you for coming in, today! Brendan Abbott looks well today. His blood pressure is under good control. For his toes, I do want him to continue the terbinafine until he has been taking for 12 weeks. Once he has been taking it for 12 weeks, he should stop the medicine. He should keep his feet clean and dry. If he continues to have problems with his feet after finishing the medication, he can come back. Otherwise, he can come back in one year like normal. Please feel free to call with any questions or concerns at any time, at [phone]. --Dr. Casper Harrison

## 2012-12-12 NOTE — Assessment & Plan Note (Addendum)
Currently about 7-8 weeks into planned 12 of terbinafine. Improving per pt, about the same per caregiver. Plan to complete terbinafine, then f/u as needed. (Of note, terbinafine was ordered as 30 tabs with 12 refills; this probably should have been only 2 refills, for total 3 months of therapy if 30 tabs = 1 month. Caregiver told that terbinafine should continue only for 12 weeks starting 10/19/2012.)

## 2013-01-18 ENCOUNTER — Telehealth: Payer: Self-pay | Admitting: Family Medicine

## 2013-01-18 NOTE — Telephone Encounter (Signed)
Left message with Rivka Barbara, at group home on machine, for Glenda to return call.  Please advise of note below per MD.  Radene Ou, CMA

## 2013-01-18 NOTE — Telephone Encounter (Signed)
Received Rx request for terbinafine with note stating that Rx has no refills remaining and new Rx would require prior auth due to "plan limits exceeded." Pt was prescribed this medication by Dr. Louanne Belton on 6/11 with 12 refills (per Dr. Radonna Ricker note, plan was for 12 weeks of therapy; Rx initially PROBABLY should have been disp #30 with TWO refills for total of 3 months supply). Per pt's pharmacy, pt picked up medication on 6/11, 7/9, and 8/10, and thus should have already completed 12 weeks of therapy. I called the number on the refill request form for prior auth ([phone]) and discussed with [employer] representative; he had no previous prior auth on file for this Rx and I discussed with him that at this point there should be no need to send in a new Rx.  Please call patient/caregiver to inform them that Mr. Taniguchi should have completed the proper course of terbinafine and does not need to refill or keep taking it, at this time. If he is still having problems with his feet or toes, he should make an appointment to come back in for a new appointment, for a new Rx or other new therapy, as appropriate. Otherwise (i.e., if he is not having any current issues), he can follow up with me, as needed. Thanks! --CMS

## 2013-02-12 ENCOUNTER — Other Ambulatory Visit: Payer: Self-pay | Admitting: Family Medicine

## 2013-02-12 MED ORDER — HYDROCHLOROTHIAZIDE 25 MG PO TABS: ORAL_TABLET | ORAL | Status: DC

## 2013-02-14 ENCOUNTER — Encounter: Payer: Self-pay | Admitting: Family Medicine

## 2013-02-21 ENCOUNTER — Other Ambulatory Visit: Payer: Self-pay | Admitting: Family Medicine

## 2013-02-21 MED ORDER — METOPROLOL TARTRATE 100 MG PO TABS: 150.0000 mg | ORAL_TABLET | Freq: Two times a day (BID) | ORAL | Status: DC

## 2013-02-26 ENCOUNTER — Ambulatory Visit: Payer: Medicare Other | Admitting: Family Medicine

## 2013-04-04 ENCOUNTER — Other Ambulatory Visit: Payer: Self-pay | Admitting: Family Medicine

## 2013-04-04 MED ORDER — LISINOPRIL 10 MG PO TABS: 10.0000 mg | ORAL_TABLET | Freq: Every day | ORAL | Status: DC

## 2013-04-23 ENCOUNTER — Ambulatory Visit (INDEPENDENT_AMBULATORY_CARE_PROVIDER_SITE_OTHER): Payer: Medicare Other | Admitting: Family Medicine

## 2013-04-23 ENCOUNTER — Encounter: Payer: Self-pay | Admitting: Family Medicine

## 2013-04-23 VITALS — BP 141/82 | HR 75 | Temp 97.8°F | Wt 194.0 lb

## 2013-04-23 DIAGNOSIS — L738 Other specified follicular disorders: Secondary | ICD-10-CM

## 2013-04-23 DIAGNOSIS — B351 Tinea unguium: Secondary | ICD-10-CM | POA: Diagnosis not present

## 2013-04-23 DIAGNOSIS — L853 Xerosis cutis: Secondary | ICD-10-CM

## 2013-04-23 MED ORDER — TRIAMCINOLONE ACETONIDE 0.1 % EX CREA: 1.0000 "application " | TOPICAL_CREAM | Freq: Two times a day (BID) | CUTANEOUS | Status: DC

## 2013-04-23 NOTE — Patient Instructions (Signed)
Use the triamcinolone ointment every day for 4 weeks. Stop using on 05/24/12.

## 2013-04-25 DIAGNOSIS — L853 Xerosis cutis: Secondary | ICD-10-CM | POA: Insufficient documentation

## 2013-04-25 NOTE — Assessment & Plan Note (Signed)
Resolved with terbinafine. Completed treatment

## 2013-04-25 NOTE — Assessment & Plan Note (Signed)
Appears like the rash on his leg is from dry skin.  Will attempt trial of triamcinolone ointment. If worst or not better, consider antifungal instead.

## 2013-04-25 NOTE — Progress Notes (Signed)
Patient ID: VERA WISHART    DOB: 07-21-1965, 47 y.o.   MRN: [id number] --- Subjective:  Keyshon is a 47 y.o.male who is brought by his caregiver for follow up on onychomycosis.  - onychomycosis: present bilaterally originally. Had 3 month course of terbinafine which finished 2 months ago. Since then, nails have grown out clear. No foot pain, no itching. His caregiver noticed that his shins and legs are excoriated. He has been itching them.   ROS: see HPI Past Medical History: reviewed and updated medications and allergies. Social History: Tobacco: none  Objective: Filed Vitals:   04/23/13 1541  BP: 141/82  Pulse: 75  Temp: 97.8 F (36.6 C)    Physical Examination:   General appearance - alert, well appearing, and in no distress Skin - excoriated on right shin and dry skin on feet and legs Nails - normal appearing toenails

## 2013-05-16 ENCOUNTER — Ambulatory Visit: Payer: Medicare Other | Admitting: Family Medicine

## 2013-05-16 ENCOUNTER — Encounter: Payer: Self-pay | Admitting: Family Medicine

## 2013-05-16 ENCOUNTER — Ambulatory Visit (INDEPENDENT_AMBULATORY_CARE_PROVIDER_SITE_OTHER): Payer: Medicare Other | Admitting: Family Medicine

## 2013-05-16 VITALS — BP 116/75 | HR 85 | Temp 99.4°F | Ht 69.0 in | Wt 195.0 lb

## 2013-05-16 DIAGNOSIS — J302 Other seasonal allergic rhinitis: Secondary | ICD-10-CM | POA: Insufficient documentation

## 2013-05-16 DIAGNOSIS — J309 Allergic rhinitis, unspecified: Secondary | ICD-10-CM | POA: Diagnosis not present

## 2013-05-16 MED ORDER — CETIRIZINE HCL 10 MG PO TABS: 10.0000 mg | ORAL_TABLET | Freq: Every day | ORAL | Status: DC

## 2013-05-16 NOTE — Patient Instructions (Signed)
Please start Zyrtec tonight at bedtime.    Allergies  Allergies may happen from anything your body is sensitive to. This may be food, medicines, pollens, chemicals, and many other things. Food allergies can be severe and deadly.  HOME CARE  If you do not know what causes a reaction, keep a diary. Write down the foods you ate and the symptoms that followed. Avoid foods that cause reactions.  If you have red raised spots (hives) or a rash:  Take medicine as told by your doctor.  Use medicines for red raised spots and itching as needed.  Apply cold cloths (compresses) to the skin. Take a cool bath. Avoid hot baths or showers.  If you are severely allergic:  It is often necessary to go to the hospital after you have treated your reaction.  Wear your medical alert jewelry.  You and your family must learn how to give a allergy shot or use an allergy kit (anaphylaxis kit).  Always carry your allergy kit or shot with you. Use this medicine as told by your doctor if a severe reaction is occurring. GET HELP RIGHT AWAY IF:  You have trouble breathing or are making high-pitched whistling sounds (wheezing).  You have a tight feeling in your chest or throat.  You have a puffy (swollen) mouth.  You have red raised spots, puffiness (swelling), or itching all over your body.  You have had a severe reaction that was helped by your allergy kit or shot. The reaction can return once the medicine has worn off.  You think you are having a food allergy. Symptoms most often happen within 30 minutes of eating a food.  Your symptoms have not gone away within 2 days or are getting worse.  You have new symptoms.  You want to retest yourself with a food or drink you think causes an allergic reaction. Only do this under the care of a doctor. MAKE SURE YOU:   Understand these instructions.  Will watch your condition.  Will get help right away if you are not doing well or get worse. Document  Released: 08/21/2012 Document Reviewed: 08/21/2012 [facility] Patient Information 2014 [place], [place].

## 2013-05-16 NOTE — Assessment & Plan Note (Addendum)
Will start zyrtec HS today.  F/u with PCP if no improvement

## 2013-05-16 NOTE — Progress Notes (Signed)
   Subjective:    Patient ID: Brendan Abbott, male    DOB: June 06, 1965, 48 y.o.   MRN: [id number]  HPI  Bilateral red eyes: Patient here for same-day appointment for bilateral red eyes that started last week. No medications were given for improvement. Caretaker states the patient is completely asymptomatic and does not complain of anything. 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 States last week that his eyes were so red she was difficult to see it is brown iris from the rest of his eye. She states this completely cleared up to date. Concern at this time if he was possibly scratching at his eyes if they were itchy. No known contacts with conjunctivitis. No fever, headache, sore throat, rash or ear pain.   Review of Systems Negative, with the exception of above mentioned in HPI     Objective:   Physical Exam BP 116/75  Pulse 85  Temp(Src) 99.4 F (37.4 C) (Oral)  Ht 5\' 9"  (1.753 m)  Wt 195 lb (88.451 kg)  BMI 28.78 kg/m2 Gen: NAD. Pleasant. Cooperative. Happy . nonverbal. HEENT: AT. [place]. Bilateral TM visualized and normal in appearance. Bilateral eyes with mild injections, no icterus. MMM. Bilateral nares with erythema. Throat without erythema or exudates.  CV: RRR  Chest: CTAB, no wheeze or crackles

## 2013-07-03 ENCOUNTER — Other Ambulatory Visit: Payer: Self-pay | Admitting: *Deleted

## 2013-07-03 MED ORDER — METOPROLOL TARTRATE 100 MG PO TABS: 150.0000 mg | ORAL_TABLET | Freq: Two times a day (BID) | ORAL | Status: DC

## 2013-08-14 ENCOUNTER — Other Ambulatory Visit: Payer: Self-pay | Admitting: *Deleted

## 2013-08-14 MED ORDER — LISINOPRIL 10 MG PO TABS: 10.0000 mg | ORAL_TABLET | Freq: Every day | ORAL | Status: DC

## 2013-08-15 ENCOUNTER — Other Ambulatory Visit: Payer: Self-pay | Admitting: *Deleted

## 2013-08-15 MED ORDER — LISINOPRIL 10 MG PO TABS: 10.0000 mg | ORAL_TABLET | Freq: Every day | ORAL | Status: DC

## 2013-08-22 ENCOUNTER — Ambulatory Visit (INDEPENDENT_AMBULATORY_CARE_PROVIDER_SITE_OTHER): Payer: Medicare Other | Admitting: Family Medicine

## 2013-08-22 ENCOUNTER — Emergency Department (HOSPITAL_COMMUNITY): Payer: Medicare Other

## 2013-08-22 ENCOUNTER — Encounter (HOSPITAL_COMMUNITY): Payer: Self-pay | Admitting: Emergency Medicine

## 2013-08-22 ENCOUNTER — Emergency Department (HOSPITAL_COMMUNITY)
Admission: EM | Admit: 2013-08-22 | Discharge: 2013-08-22 | Disposition: A | Discharge status: Home / Self Care | Payer: Medicare Other | Attending: Emergency Medicine | Admitting: Emergency Medicine

## 2013-08-22 VITALS — BP 132/90 | HR 101 | Temp 98.2°F | Ht 69.0 in | Wt 189.0 lb

## 2013-08-22 DIAGNOSIS — IMO0002 Reserved for concepts with insufficient information to code with codable children: Secondary | ICD-10-CM | POA: Diagnosis not present

## 2013-08-22 DIAGNOSIS — M25562 Pain in left knee: Secondary | ICD-10-CM

## 2013-08-22 DIAGNOSIS — I10 Essential (primary) hypertension: Secondary | ICD-10-CM | POA: Diagnosis not present

## 2013-08-22 DIAGNOSIS — M1710 Unilateral primary osteoarthritis, unspecified knee: Secondary | ICD-10-CM | POA: Diagnosis not present

## 2013-08-22 DIAGNOSIS — M25462 Effusion, left knee: Secondary | ICD-10-CM

## 2013-08-22 DIAGNOSIS — Z79899 Other long term (current) drug therapy: Secondary | ICD-10-CM | POA: Insufficient documentation

## 2013-08-22 DIAGNOSIS — M25569 Pain in unspecified knee: Secondary | ICD-10-CM

## 2013-08-22 DIAGNOSIS — Z7982 Long term (current) use of aspirin: Secondary | ICD-10-CM | POA: Insufficient documentation

## 2013-08-22 DIAGNOSIS — M25469 Effusion, unspecified knee: Secondary | ICD-10-CM | POA: Insufficient documentation

## 2013-08-22 DIAGNOSIS — R6250 Unspecified lack of expected normal physiological development in childhood: Secondary | ICD-10-CM | POA: Insufficient documentation

## 2013-08-22 DIAGNOSIS — M7989 Other specified soft tissue disorders: Secondary | ICD-10-CM | POA: Diagnosis not present

## 2013-08-22 HISTORY — DX: Essential (primary) hypertension: I10

## 2013-08-22 HISTORY — DX: Unspecified intellectual disabilities: F79

## 2013-08-22 LAB — CBC WITH DIFFERENTIAL/PLATELET
Basophils Absolute: 0 10*3/uL (ref 0.0–0.1)
Basophils Relative: 0 % (ref 0–1)
Eosinophils Absolute: 0.1 10*3/uL (ref 0.0–0.7)
Eosinophils Relative: 1 % (ref 0–5)
HCT: 50.4 % (ref 39.0–52.0)
Hemoglobin: 16.9 g/dL (ref 13.0–17.0)
Lymphocytes Relative: 28 % (ref 12–46)
Lymphs Abs: 2 10*3/uL (ref 0.7–4.0)
MCH: 26.3 pg (ref 26.0–34.0)
MCHC: 33.5 g/dL (ref 30.0–36.0)
MCV: 78.4 fL (ref 78.0–100.0)
Monocytes Absolute: 0.7 10*3/uL (ref 0.1–1.0)
Monocytes Relative: 10 % (ref 3–12)
Neutro Abs: 4.4 10*3/uL (ref 1.7–7.7)
Neutrophils Relative %: 61 % (ref 43–77)
Platelets: 202 10*3/uL (ref 150–400)
RBC: 6.43 MIL/uL — ABNORMAL HIGH (ref 4.22–5.81)
RDW: 14.1 % (ref 11.5–15.5)
WBC: 7.2 10*3/uL (ref 4.0–10.5)

## 2013-08-22 MED ORDER — IBUPROFEN 400 MG PO TABS
600.0000 mg | ORAL_TABLET | Freq: Once | ORAL | Status: AC
  Administered 2013-08-22: 600 mg via ORAL
  Filled 2013-08-22 (×2): qty 1

## 2013-08-22 MED ORDER — HYDROCODONE-ACETAMINOPHEN 5-325 MG PO TABS
1.0000 | ORAL_TABLET | Freq: Once | ORAL | Status: AC
  Administered 2013-08-22: 1 via ORAL
  Filled 2013-08-22: qty 1

## 2013-08-22 MED ORDER — HYDROCODONE-ACETAMINOPHEN 5-325 MG PO TABS: 1.0000 | ORAL_TABLET | Freq: Four times a day (QID) | ORAL | Status: DC | PRN

## 2013-08-22 MED ORDER — IBUPROFEN 600 MG PO TABS: 600.0000 mg | ORAL_TABLET | Freq: Four times a day (QID) | ORAL | Status: DC | PRN

## 2013-08-22 NOTE — Discharge Instructions (Signed)
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  No effusion, or joint Was attempted with new fluid removed, your white count is normal.  This has been discussed with family practice, who agrees with discharge him anti-inflammatories on a regular basis, knee sleeve for comfort, and followup in the office on Friday, at 3 PM with Dr. Kateri PlummerJeremy Abbott.

## 2013-08-22 NOTE — Progress Notes (Signed)
Patient ID: Brendan Abbott, male   DOB: [birth year], 48 y.o.   MRN: [id number]  Kevin FentonSamuel Karna Abed, MD Phone: [phone]  Subjective:  Chief complaint-noted  # SDA for L knee pain  48 y/o male with history of MR that's living in a group home here with complaints of progressive L knee pain starting lat elast week.   His group home leader is present and states that last Thursday or Friday he reports falling and hurting his knee at school. At that time he was walking and doing fine. He had some  Complaints of nonspecific pain through the next two days or so and then she saw him holding his knee Monday. She examined it and describes the appearance of bruising. Then he was just limping, today he has been lying in bed, not eating, and she states he can barely bear weight on it.   She denies overt fever, difficulty breathing, or swelling in other areas.  He doesn't have a Hx of gout or pseudogout.   ROS-  No fever, + malasie and decreased PO intake + knee pain and swelling, + limping.  No dyspnea  Past Medical History Patient Active Problem List   Diagnosis Date Noted  . Left knee pain 08/22/2013  . Seasonal allergies 05/16/2013  . Xerosis of skin 04/25/2013  . 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 UNSPECIFIED URINARY INCONTINENCE 07/11/2008  . 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 OBESITY 05/19/2007  . MENTAL RETARDATION 07/07/2006  . HYPERTENSION, BENIGN SYSTEMIC 07/07/2006    Medications- reviewed and updated Current Outpatient Prescriptions  Medication Sig Dispense Refill  . aspirin (QC CHILDRENS ASPIRIN) 81 MG chewable tablet Chew 1 tablet (81 mg total) by mouth daily.  30 tablet  6  . cetirizine (ZYRTEC) 10 MG tablet Take 1 tablet (10 mg total) by mouth daily.  30 tablet  11  . hydrochlorothiazide (HYDRODIURIL) 25 MG tablet TAKE 1 TABLET ONCE DAILY FOR HYPERTENSION  30 tablet  6  . lisinopril (PRINIVIL) 10 MG tablet Take 1 tablet (10 mg total) by mouth daily.  30 tablet  3  .  metoprolol (LOPRESSOR) 100 MG tablet Take 1.5 tablets (150 mg total) by mouth 2 (two) times daily.  90 tablet  3  . triamcinolone cream (KENALOG) 0.1 % Apply 1 application topically 2 (two) times daily.  85.2 g  0   No current facility-administered medications for this visit.    Objective: BP 132/90  Pulse 101  Temp(Src) 98.2 F (36.8 C) (Oral)  Ht 5\' 9"  (1.753 m)  Wt 189 lb (85.73 kg)  BMI 27.90 kg/m2 Gen: NAD, alert, cooperative with exam HEENT: NCAT CV: RRR, good S1/S2, no murmur Resp: CTABL, no wheezes, non-labored Abd: SNTND, BS present, no guarding or organomegaly Ext: No edema, warm Neuro: Alert and oriented, No gross deficits Skin: no rash any where but knee, L knee with 3-4 flat erythemetous blanchable macules approx 1 cm to 3cm in diameter.  MSK: L knee with slight diffuse edema, erythematous splotches as above, warmth compared to the R, pain with extenstion of knee, cannot fully extend, no joint laxity, tenderness to palp greatest over patellar tendon but diffusely tender on medial aspect.    Assessment/Plan:  Left knee pain L knee pain with warmth, erythema, and recent Hx iof trauma Considering his hx and malaise and anorexia today I am reasonably suspicious of septic arthritis.  Other possibility include ligamentous injury, effusion, psuedogiout or gout.  Considered aspiration with crystal anal and cell count but I think  pre test prob of septic arthritis is to great to send home.  Will send to ED for evaluation, appreciate their help.  Discussed with triage nurse, group home owner accompanying him and agreeable.

## 2013-08-22 NOTE — Patient Instructions (Signed)
It was great to meet you today!  I think there is enough possibility of a knee infection to send him to the ER for formal testing.   They will be able to get a sample of the fluid and follow up on it before sending him home.

## 2013-08-22 NOTE — Assessment & Plan Note (Signed)
L knee pain with warmth, erythema, and recent Hx iof trauma Considering his hx and malaise and anorexia today I am reasonably suspicious of septic arthritis.  Other possibility include ligamentous injury, effusion, psuedogiout or gout.  Considered aspiration with crystal anal and cell count but I think pre test prob of septic arthritis is to great to send home.  Will send to ED for evaluation, appreciate their help.  Discussed with triage nurse, group home owner accompanying him and agreeable.

## 2013-08-22 NOTE — ED Notes (Signed)
Pt. presents with progressing left knee pain / swelling onset last week , no fall or injury , [facility] here for further evaluation and pain management.

## 2013-08-22 NOTE — ED Provider Notes (Signed)
CSN: [id number]     Arrival date & time 08/22/13  1918 History   First MD Initiated Contact with Patient 08/22/13 1950     Chief Complaint  Patient presents with  . Knee Pain     (Consider location/radiation/quality/duration/timing/severity/associated sxs/prior Treatment) HPI Comments: Patient with developmental delay, who lives in a group home sent from the family practice clinic today with a report of 7 days of left knee discomfort. Caregiver noticed on this Thursday or Friday that he was slightly limping.  The limping got worse on Sunday.  He was given one dose of ibuprofen before bed, Monday and Tuesday.  He was noted to be limping, worse.  They did look at his knee on Monday.  He had slight swelling and bruising medially.  His knee was again examined yesterday and had increased swelling.  It was red and warm to the touch. When he went to the family practice clinic today.  They initially were going to admit him for a "septic arthritis."  But decided to send him to the emergency department, because we could get definitive testing faster. Patient has had decreased activity level is not been noted to have a fever, change in appetite.  Patient is a 48 y.o. male presenting with knee pain. The history is provided by a caregiver.  Knee Pain Location:  Knee Time since incident:  7 days Lower extremity injury: Unknown.   Knee location:  L knee Pain details:    Quality:  Unable to specify   Radiates to:  Does not radiate   Severity:  Unable to specify   Onset quality:  Gradual   Duration:  7 days   Timing:  Unable to specify   Progression:  Worsening Chronicity:  New Dislocation: no   Foreign body present:  Unable to specify Tetanus status:  Unknown Prior injury to area:  Unable to specify Relieved by:  None tried Worsened by:  Activity Ineffective treatments:  NSAIDs (One dose of ibuprofen 3, days, ago) Associated symptoms: decreased ROM   Associated symptoms: no fever   Risk  factors: no concern for non-accidental trauma     Past Medical History  Diagnosis Date  . Hypertension   . MR (mental retardation)    History reviewed. No pertinent past surgical history. No family history on file. History  Substance Use Topics  . Smoking status: Never Smoker   . Smokeless tobacco: Not on file  . Alcohol Use: No    Review of Systems  Constitutional: Negative for fever.  Musculoskeletal: Positive for joint swelling.  Skin: Negative for wound.  All other systems reviewed and are negative.     Allergies  Review of patient's allergies indicates no known allergies.  Home Medications   Prior to Admission medications   Medication Sig Start Date End Date Taking? Authorizing Provider  aspirin (QC CHILDRENS ASPIRIN) 81 MG chewable tablet Chew 1 tablet (81 mg total) by mouth daily. 08/21/12  Yes Everrett Coombeody Matthews, DO  hydrochlorothiazide (HYDRODIURIL) 25 MG tablet Take 25 mg by mouth daily.   Yes Historical Provider, MD  ibuprofen (ADVIL,MOTRIN) 600 MG tablet Take 600 mg by mouth once.   Yes Historical Provider, MD  lisinopril (PRINIVIL) 10 MG tablet Take 1 tablet (10 mg total) by mouth daily. 08/15/13  Yes Stephanie Couphristopher M Street, MD  metoprolol (LOPRESSOR) 100 MG tablet Take 1.5 tablets (150 mg total) by mouth 2 (two) times daily. 07/03/13  Yes Stephanie Couphristopher M Street, MD  triamcinolone cream (KENALOG) 0.1 % Apply 1  application topically daily.   Yes Historical Provider, MD   BP 120/73  Pulse 79  Temp(Src) 98.5 F (36.9 C) (Oral)  Resp 16  SpO2 97% Physical Exam  Nursing note and vitals reviewed. Constitutional: He appears well-developed and well-nourished. No distress.  HENT:  Head: Normocephalic.  Eyes: Pupils are equal, round, and reactive to light.  Neck: Normal range of motion.  Cardiovascular: Normal rate and regular rhythm.   Pulmonary/Chest: Effort normal and breath sounds normal.  Musculoskeletal: He exhibits edema and tenderness.       Left knee: He  exhibits decreased range of motion, swelling, effusion, ecchymosis and erythema. He exhibits no laceration and normal alignment. Tenderness found. Medial joint line tenderness noted.       Legs: Neurological: He is alert.  Skin: Skin is warm and dry. There is erythema.    ED Course  ARTHOCENTESIS Date/Time: 08/22/2013 9:43 PM Performed by: Arman FilterSCHULZ, Haywood Meinders K Authorized by: Arman FilterSCHULZ, Elzia Hott K Consent: Verbal consent not obtained. written consent not obtained. Risks and benefits: risks, benefits and alternatives were discussed Consent given by: guardian Patient understanding: patient does not state understanding of the procedure being performed Patient identity confirmed: verbally with patient and arm band Time out: Immediately prior to procedure a "time out" was called to verify the correct patient, procedure, equipment, support staff and site/side marked as required. Indications: joint swelling,  pain,  possible septic joint and diagnostic evaluation  Body area: knee Local anesthesia used: yes Anesthesia: local infiltration Local anesthetic: lidocaine 2% without epinephrine Patient sedated: no Preparation: Patient was prepped and draped in the usual sterile fashion. Needle gauge: 22 G Ultrasound guidance: no Approach: lateral Aspirate amount: 0 ml Patient tolerance: Patient tolerated the procedure well with no immediate complications. Comments: Procedure preformed By DR. Purvis SheffieldForrest Harrison    (including critical care time) Labs Review Labs Reviewed  CBC WITH DIFFERENTIAL - Abnormal; Notable for the following:    RBC 6.43 (*)    All other components within normal limits  COMPREHENSIVE METABOLIC PANEL    Imaging Review Dg Knee 2 Views Left  08/22/2013   CLINICAL DATA:  Pain.  No injury.  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 No definite effusion or fracture. Enthesophytes over the superior and inferior poles  of the patella.  IMPRESSION: No acute findings.  Mild osteoarthritic change.   Electronically Signed   By: Elberta Fortisaniel  Boyle M.D.   On: 08/22/2013 20:44     EKG Interpretation None      MDM  Dr. Romeo AppleHarrison attempted arthrocentesis X2 without results Pateint tolerated procedure   I discussed lab results results with family practice, who agrees with plan for anti-inflammatories on a regular basis, knee sleeve, Vicodin for extreme pain and follow up in the office on Friday to 3 p.m. with Dr. Kateri PlummerJeremy Smith This was discussed with caregiver, who agrees Final diagnoses:  Swelling of knee joint, left         Arman FilterGail K Tashiya Souders, NP 08/22/13 2218

## 2013-08-23 NOTE — ED Provider Notes (Signed)
Medical screening examination/treatment/procedure(s) were performed by non-physician practitioner and as supervising physician I was immediately available for consultation/collaboration.   EKG Interpretation None       Raeford RazorStephen Hajar Penninger, MD 08/23/13 1424

## 2013-08-24 ENCOUNTER — Encounter: Payer: Self-pay | Admitting: Family Medicine

## 2013-08-24 ENCOUNTER — Ambulatory Visit (INDEPENDENT_AMBULATORY_CARE_PROVIDER_SITE_OTHER): Payer: Medicare Other | Admitting: Family Medicine

## 2013-08-24 VITALS — BP 128/77 | HR 93 | Temp 98.1°F | Wt 196.2 lb

## 2013-08-24 DIAGNOSIS — M25569 Pain in unspecified knee: Secondary | ICD-10-CM | POA: Diagnosis not present

## 2013-08-24 DIAGNOSIS — M25562 Pain in left knee: Secondary | ICD-10-CM

## 2013-08-24 MED ORDER — HYDROCODONE-ACETAMINOPHEN 5-325 MG PO TABS: 1.0000 | ORAL_TABLET | Freq: Four times a day (QID) | ORAL | Status: DC | PRN

## 2013-08-24 NOTE — Patient Instructions (Signed)
Thank you for coming in,   Please continue taking the norco and ibuprofen. Please perform the home modalities. If you are not better in 2 weeks then we will try something different.   Please follow up with me or Dr. Casper HarrisonStreet in two weeks.    Please feel free to call with any questions or concerns at any time, at [phone]. --Dr. Jordan LikesSchmitz

## 2013-08-27 ENCOUNTER — Encounter: Payer: Self-pay | Admitting: Family Medicine

## 2013-08-27 NOTE — Progress Notes (Signed)
   Subjective:    Patient ID: Brendan Abbott, male    DOB: 1966-04-23, 48 y.o.   MRN: [id number]  HPI Brendan Abbott is here for f/u to his left knee pain. Patient has history of mental retardation so history is provided by caregiver.  Patient was seen in clinic and sent to the ED for evaluation of possible septic arthritis. While in the ED, there were 2 attempts to aspirate his knee. These were unsuccessful and so he was treated with ibuprofen and Norco. Since then his pain is improved and so has his range of motion of his knee. The x-rays of his left knee showed no acute findings. He has had no fevers or chills. The swelling of his left knee he has reduced. He still has pain upon movement. Pain is improved while taking the pain medications.   Current Outpatient Prescriptions on File Prior to Visit  Medication Sig Dispense Refill  . aspirin (QC CHILDRENS ASPIRIN) 81 MG chewable tablet Chew 1 tablet (81 mg total) by mouth daily.  30 tablet  6  . hydrochlorothiazide (HYDRODIURIL) 25 MG tablet Take 25 mg by mouth daily.      Marland Kitchen. ibuprofen (ADVIL,MOTRIN) 600 MG tablet Take 600 mg by mouth once.      Marland Kitchen. ibuprofen (ADVIL,MOTRIN) 600 MG tablet Take 1 tablet (600 mg total) by mouth every 6 (six) hours as needed.  30 tablet  0  . lisinopril (PRINIVIL) 10 MG tablet Take 1 tablet (10 mg total) by mouth daily.  30 tablet  3  . metoprolol (LOPRESSOR) 100 MG tablet Take 1.5 tablets (150 mg total) by mouth 2 (two) times daily.  90 tablet  3  . triamcinolone cream (KENALOG) 0.1 % Apply 1 application topically daily.       No current facility-administered medications on file prior to visit.     Review of Systems See HPI     Objective:   Physical Exam BP 128/77  Pulse 93  Temp(Src) 98.1 F (36.7 C) (Oral)  Wt 196 lb 3.2 oz (88.996 kg) Gen: NAD, alert, cooperative with exam, well-appearing CV: +2 pulses in b/l lower extremities.  MSK: 5/5 strength in flexion and extension of b/l lower extremities,  Left  knee: Exam limited due to swelling and pain, swelling noticed, warmer than right knee, negative anterior drawer, most tender over patella ligament, meniscal palpation is non tender  Right knee: normal range of motion with no tenderness on flexion or extension.  Skin: no streaking or signs of penetrating trauma        Assessment & Plan:

## 2013-08-27 NOTE — Assessment & Plan Note (Signed)
Most likely prepatellar bursitis. No systemic signs of infection and unable to aspirate the joint in the ED. - Continue ibuprofen - Norco 15 tablets Q6 PRN, start to wean off as pain improves  - Given home modalities to improve range of motion - Will followup in 2 weeks to determine if pain is improved and if any need for physical therapy or further imaging such as ultrasound or MRI - Discussed with Dr. Perley JainMcdiarmid.

## 2013-09-12 ENCOUNTER — Encounter: Payer: Self-pay | Admitting: Family Medicine

## 2013-09-12 ENCOUNTER — Ambulatory Visit (INDEPENDENT_AMBULATORY_CARE_PROVIDER_SITE_OTHER): Payer: Medicare Other | Admitting: Family Medicine

## 2013-09-12 VITALS — BP 125/86 | HR 69 | Temp 98.5°F | Resp 18 | Wt 192.0 lb

## 2013-09-12 DIAGNOSIS — M25569 Pain in unspecified knee: Secondary | ICD-10-CM | POA: Diagnosis not present

## 2013-09-12 DIAGNOSIS — M25562 Pain in left knee: Secondary | ICD-10-CM

## 2013-09-12 MED ORDER — IBUPROFEN 600 MG PO TABS: 600.0000 mg | ORAL_TABLET | Freq: Three times a day (TID) | ORAL | Status: DC | PRN

## 2013-09-12 NOTE — Patient Instructions (Signed)
Discontinue order - stop Norco (hydrocodone-acetaminophin). Start ibuprofen 600 mg up to three times per day (every 8 hours) as needed for knee pain.  Thank you for coming in, today!  Demetrios's knee looks and sounds improved. He doesn't need any special imaging or referral at this point. He can continue ibuprofen as needed, as above. He should come back to see me as needed. He will be due for a yearly visit sometime after August.  Please feel free to call with any questions or concerns at any time, at [phone]. --Dr. Casper HarrisonStreet

## 2013-09-12 NOTE — Assessment & Plan Note (Signed)
Likely prepatellar bursitis +/- strain of knee-surrounding muscles or sprain of ligaments. Generally improved, near baseline with only occasional ibuprofen use. Discontinue Norco, Rx for ibuprofen 600 mg q8 (order written on pt instruction and signed for group home administration of med). F/u as needed; could consider referral to PT if not continuing to improve or if worsens again, though not sure how well pt would be able to participate with PT given mental disability.

## 2013-09-12 NOTE — Progress Notes (Signed)
   Subjective:    Patient ID: Brendan Abbott, male    DOB: 07-26-1965, 48 y.o.   MRN: [id number]  HPI: Pt presents to clinic for f/u of left knee pain; pt lives in a group home for intellectual disability, brought in by caregiver Kelli Churn(Michelle Liggins). Previous concern for septic joint (essentially ruled out through f/u visits) with eventual working diagnosis of likely prepatellar bursitis. Generally has been doing much better; has finished short course of Norco and had been taking ibuprofen sparingly, mostly at night after daytime activities, with occasional pain in the mornings. Pt does not typically complain, but caregiver states he has seemed to be better in terms of pain, with no limitation on ambulation. He has had no systemic symptoms such as frank fever, rashes, N/V, abdominal or other MSK pain.  Review of Systems: As above; limited due to pt's mental capacity, but generally negative other than improving knee pain.     Objective:   Physical Exam BP 125/86  Pulse 69  Temp(Src) 98.5 F (36.9 C) (Oral)  Resp 18  Wt 192 lb (87.091 kg)  SpO2 95% Gen: well-appearing adult male in NAD; quiet, typically answers with single-word or yes or no answers MSK: left knee very mildly swollen compared to right, not red or obviously tender along lateral or medial joint lines  No frank effusion or warmth noted to either knee  Normal ROM through bilateral knees, pt able to bear weight and ambulate without assistance  No increased pain or laxity with valgus or varus stress to left knee  Negative Thessaly test  Strength 5/5 through both LE     Assessment & Plan:

## 2013-10-04 ENCOUNTER — Telehealth: Payer: Self-pay | Admitting: Family Medicine

## 2013-10-04 NOTE — Telephone Encounter (Signed)
Okay to discontinue if he's no longer using or needing it. He can follow up as needed. Thanks. --CMS

## 2013-10-04 NOTE — Telephone Encounter (Signed)
Care giver called and would like to discontinue the ibuprofen for [facility]. jw

## 2013-10-04 NOTE — Telephone Encounter (Signed)
Forward to PCP for approval.Robert L Busick

## 2013-10-04 NOTE — Telephone Encounter (Signed)
Attempted to return call, no answer or voicemail. If the caregiver returns call please tell him/her that it is ok to discontinue the ibuprofen per Dr Casper Harrison.Doris Cheadle Busick

## 2013-10-08 ENCOUNTER — Other Ambulatory Visit: Payer: Self-pay | Admitting: *Deleted

## 2013-10-08 MED ORDER — HYDROCHLOROTHIAZIDE 25 MG PO TABS: 25.0000 mg | ORAL_TABLET | Freq: Every day | ORAL | Status: DC

## 2013-10-18 ENCOUNTER — Encounter: Payer: Self-pay | Admitting: Family Medicine

## 2013-10-18 NOTE — Progress Notes (Signed)
Caregiver is needing a letter stating that it is ok to discontinue his ibuprofen.  Please call when ready to be picked up.

## 2013-10-19 ENCOUNTER — Encounter: Payer: Self-pay | Admitting: Family Medicine

## 2013-10-19 NOTE — Progress Notes (Signed)
I do not know the number to call the caregiver back, it is not listed in the note. If he/she returns call please tell him the letter is ready to be picked up.Louden Houseworth, Rodena Medinobert Lee

## 2013-10-19 NOTE — Progress Notes (Signed)
Letter written and left up front to be picked up. Thanks! --CMS

## 2013-10-19 NOTE — Progress Notes (Signed)
Forward to PCP to ok discontinuation of ibuprofen and letter.Sakia Schrimpf, Rodena Medinobert Lee

## 2013-11-13 ENCOUNTER — Other Ambulatory Visit: Payer: Self-pay | Admitting: *Deleted

## 2013-11-13 MED ORDER — METOPROLOL TARTRATE 100 MG PO TABS: 150.0000 mg | ORAL_TABLET | Freq: Two times a day (BID) | ORAL | Status: DC

## 2013-12-19 ENCOUNTER — Other Ambulatory Visit: Payer: Self-pay | Admitting: *Deleted

## 2013-12-20 MED ORDER — LISINOPRIL 10 MG PO TABS: 10.0000 mg | ORAL_TABLET | Freq: Every day | ORAL | Status: DC

## 2013-12-22 ENCOUNTER — Emergency Department (INDEPENDENT_AMBULATORY_CARE_PROVIDER_SITE_OTHER): Payer: Medicare Other

## 2013-12-22 ENCOUNTER — Encounter (HOSPITAL_COMMUNITY): Payer: Self-pay | Admitting: Emergency Medicine

## 2013-12-22 ENCOUNTER — Emergency Department (INDEPENDENT_AMBULATORY_CARE_PROVIDER_SITE_OTHER)
Admission: EM | Admit: 2013-12-22 | Discharge: 2013-12-22 | Disposition: A | Discharge status: Home / Self Care | Payer: Medicare Other | Source: Home / Self Care | Attending: Family Medicine | Admitting: Family Medicine

## 2013-12-22 DIAGNOSIS — M7710 Lateral epicondylitis, unspecified elbow: Secondary | ICD-10-CM

## 2013-12-22 DIAGNOSIS — M7032 Other bursitis of elbow, left elbow: Secondary | ICD-10-CM

## 2013-12-22 DIAGNOSIS — M79609 Pain in unspecified limb: Secondary | ICD-10-CM | POA: Diagnosis not present

## 2013-12-22 MED ORDER — DICLOFENAC POTASSIUM 50 MG PO TABS: 50.0000 mg | ORAL_TABLET | Freq: Three times a day (TID) | ORAL | Status: DC

## 2013-12-22 NOTE — ED Notes (Signed)
C/o left arm pain Caregiver States last night patient was moaning in pain Did shower as tx Caregiver States patient will not move arm

## 2013-12-22 NOTE — Discharge Instructions (Signed)
Wear sling for comfort, use ice and medicine for pain as needed, see your doctor if further pain.

## 2013-12-22 NOTE — ED Provider Notes (Signed)
CSN: [id number]     Arrival date & time 12/22/13  1605 History   First MD Initiated Contact with Patient 12/22/13 1611     Chief Complaint  Patient presents with  . Arm Pain   (Consider location/radiation/quality/duration/timing/severity/associated sxs/prior Treatment) Patient is a 48 y.o. male presenting with arm pain. The history is provided by the patient.  Arm Pain This is a new problem. The current episode started yesterday. The problem has been gradually worsening. Associated symptoms comments: NKI, no prior hx of same, denies trauma., no other complaints..    Past Medical History  Diagnosis Date  . Hypertension   . MR (mental retardation)    History reviewed. No pertinent past surgical history. History reviewed. No pertinent family history. History  Substance Use Topics  . Smoking status: Never Smoker   . Smokeless tobacco: Not on file  . Alcohol Use: No    Review of Systems  Constitutional: Negative.   Musculoskeletal: Positive for joint swelling.  Skin: Negative.     Allergies  Review of patient's allergies indicates no known allergies.  Home Medications   Prior to Admission medications   Medication Sig Start Date End Date Taking? Authorizing Provider  aspirin (QC CHILDRENS ASPIRIN) 81 MG chewable tablet Chew 1 tablet (81 mg total) by mouth daily. 08/21/12   Everrett Coombeody Matthews, DO  diclofenac (CATAFLAM) 50 MG tablet Take 1 tablet (50 mg total) by mouth 3 (three) times daily. For elbow pain 12/22/13   Linna HoffJames D Velna Hedgecock, MD  hydrochlorothiazide (HYDRODIURIL) 25 MG tablet Take 1 tablet (25 mg total) by mouth daily. 10/08/13   Stephanie Couphristopher M Street, MD  lisinopril (PRINIVIL) 10 MG tablet Take 1 tablet (10 mg total) by mouth daily. 12/20/13   Stephanie Couphristopher M Street, MD  metoprolol (LOPRESSOR) 100 MG tablet Take 1.5 tablets (150 mg total) by mouth 2 (two) times daily. 11/13/13   Stephanie Couphristopher M Street, MD  triamcinolone cream (KENALOG) 0.1 % Apply 1 application topically daily.     Historical Provider, MD   BP 116/78  Pulse 88  Temp(Src) 99.4 F (37.4 C) (Oral)  Resp 16  SpO2 96% Physical Exam  Nursing note and vitals reviewed. Constitutional: He is oriented to person, place, and time. He appears well-developed and well-nourished.  Musculoskeletal: He exhibits tenderness.  Tender sts over olecranon of left elbow, pain and limitation of full rom.  Neurological: He is alert and oriented to person, place, and time.  Skin: Skin is warm and dry.    ED Course  Procedures (including critical care time) Labs Review Labs Reviewed - No data to display  Imaging Review Dg Elbow Complete Left  12/22/2013   CLINICAL DATA:  Arm pain  EXAM: LEFT ELBOW - COMPLETE 3+ VIEW  COMPARISON:  None  FINDINGS: Osseous mineralization normal.  Nonstandard imaging, patient unable to extend elbow.  Bulky olecranon spur formation.  Joint spaces grossly preserved.  No acute fracture, dislocation, or bone destruction.  IMPRESSION: Bulky olecranon spur.  No definite acute osseous findings.   Electronically Signed   By: Ulyses SouthwardMark  Boles M.D.   On: 12/22/2013 17:05     MDM   1. Epitrochlear bursitis of left elbow        Linna HoffJames D Farhan Jean, MD 12/22/13 [phone]

## 2013-12-26 ENCOUNTER — Encounter: Payer: Self-pay | Admitting: Family Medicine

## 2013-12-26 ENCOUNTER — Observation Stay (HOSPITAL_COMMUNITY)
Admission: AD | Admit: 2013-12-26 | Discharge: 2013-12-27 | Disposition: A | Discharge status: Home / Self Care | Payer: Medicare Other | Source: Ambulatory Visit | Attending: Family Medicine | Admitting: Family Medicine

## 2013-12-26 ENCOUNTER — Ambulatory Visit (INDEPENDENT_AMBULATORY_CARE_PROVIDER_SITE_OTHER): Payer: Medicare Other | Admitting: Family Medicine

## 2013-12-26 VITALS — BP 121/85 | HR 98 | Temp 98.2°F | Wt 191.0 lb

## 2013-12-26 DIAGNOSIS — I10 Essential (primary) hypertension: Secondary | ICD-10-CM | POA: Diagnosis not present

## 2013-12-26 DIAGNOSIS — M25529 Pain in unspecified elbow: Principal | ICD-10-CM

## 2013-12-26 DIAGNOSIS — M25429 Effusion, unspecified elbow: Secondary | ICD-10-CM | POA: Insufficient documentation

## 2013-12-26 DIAGNOSIS — M898X9 Other specified disorders of bone, unspecified site: Secondary | ICD-10-CM | POA: Insufficient documentation

## 2013-12-26 DIAGNOSIS — F79 Unspecified intellectual disabilities: Secondary | ICD-10-CM

## 2013-12-26 DIAGNOSIS — E669 Obesity, unspecified: Secondary | ICD-10-CM | POA: Diagnosis not present

## 2013-12-26 DIAGNOSIS — Z7982 Long term (current) use of aspirin: Secondary | ICD-10-CM | POA: Diagnosis not present

## 2013-12-26 DIAGNOSIS — M25422 Effusion, left elbow: Secondary | ICD-10-CM

## 2013-12-26 DIAGNOSIS — Z79899 Other long term (current) drug therapy: Secondary | ICD-10-CM | POA: Insufficient documentation

## 2013-12-26 DIAGNOSIS — M25522 Pain in left elbow: Secondary | ICD-10-CM | POA: Diagnosis present

## 2013-12-26 LAB — CBC WITH DIFFERENTIAL/PLATELET
Basophils Absolute: 0 10*3/uL (ref 0.0–0.1)
Basophils Relative: 0 % (ref 0–1)
EOS ABS: 0.1 10*3/uL (ref 0.0–0.7)
EOS PCT: 2 % (ref 0–5)
HEMATOCRIT: 51.1 % (ref 39.0–52.0)
Hemoglobin: 16.6 g/dL (ref 13.0–17.0)
LYMPHS ABS: 1.7 10*3/uL (ref 0.7–4.0)
LYMPHS PCT: 34 % (ref 12–46)
MCH: 26.1 pg (ref 26.0–34.0)
MCHC: 32.5 g/dL (ref 30.0–36.0)
MCV: 80.2 fL (ref 78.0–100.0)
MONO ABS: 0.4 10*3/uL (ref 0.1–1.0)
Monocytes Relative: 8 % (ref 3–12)
Neutro Abs: 2.8 10*3/uL (ref 1.7–7.7)
Neutrophils Relative %: 56 % (ref 43–77)
Platelets: 193 10*3/uL (ref 150–400)
RBC: 6.37 MIL/uL — AB (ref 4.22–5.81)
RDW: 14.4 % (ref 11.5–15.5)
WBC: 5 10*3/uL (ref 4.0–10.5)

## 2013-12-26 LAB — COMPREHENSIVE METABOLIC PANEL
ALBUMIN: 3.8 g/dL (ref 3.5–5.2)
ALT: 14 U/L (ref 0–53)
AST: 19 U/L (ref 0–37)
Alkaline Phosphatase: 79 U/L (ref 39–117)
Anion gap: 17 — ABNORMAL HIGH (ref 5–15)
BILIRUBIN TOTAL: 1.6 mg/dL — AB (ref 0.3–1.2)
BUN: 17 mg/dL (ref 6–23)
CALCIUM: 9.8 mg/dL (ref 8.4–10.5)
CHLORIDE: 96 meq/L (ref 96–112)
CO2: 25 meq/L (ref 19–32)
CREATININE: 1 mg/dL (ref 0.50–1.35)
GFR calc Af Amer: >90 mL/min (ref >90)
GFR, EST NON AFRICAN AMERICAN: 88 mL/min — AB (ref >90)
Glucose, Bld: 145 mg/dL — ABNORMAL HIGH (ref 70–99)
Potassium: 3.7 mEq/L (ref 3.7–5.3)
SODIUM: 138 meq/L (ref 137–147)
Total Protein: 8 g/dL (ref 6.0–8.3)

## 2013-12-26 MED ORDER — LISINOPRIL 10 MG PO TABS
10.0000 mg | ORAL_TABLET | Freq: Every day | ORAL | Status: DC
  Administered 2013-12-27: 10 mg via ORAL
  Filled 2013-12-26: qty 1

## 2013-12-26 MED ORDER — ASPIRIN 81 MG PO CHEW
81.0000 mg | CHEWABLE_TABLET | Freq: Every day | ORAL | Status: DC
  Administered 2013-12-27: 81 mg via ORAL
  Filled 2013-12-26: qty 1

## 2013-12-26 MED ORDER — IBUPROFEN 600 MG PO TABS
600.0000 mg | ORAL_TABLET | Freq: Four times a day (QID) | ORAL | Status: DC | PRN
  Administered 2013-12-26: 600 mg via ORAL
  Filled 2013-12-26 (×3): qty 1

## 2013-12-26 MED ORDER — HEPARIN SODIUM (PORCINE) 5000 UNIT/ML IJ SOLN
5000.0000 [IU] | Freq: Three times a day (TID) | INTRAMUSCULAR | Status: DC
  Administered 2013-12-26 – 2013-12-27 (×2): 5000 [IU] via SUBCUTANEOUS
  Filled 2013-12-26 (×4): qty 1

## 2013-12-26 MED ORDER — METOPROLOL TARTRATE 50 MG PO TABS
150.0000 mg | ORAL_TABLET | Freq: Two times a day (BID) | ORAL | Status: DC
  Administered 2013-12-26 – 2013-12-27 (×2): 150 mg via ORAL
  Filled 2013-12-26 (×3): qty 1

## 2013-12-26 MED ORDER — HYDROCHLOROTHIAZIDE 25 MG PO TABS
25.0000 mg | ORAL_TABLET | Freq: Every day | ORAL | Status: DC
  Administered 2013-12-27: 25 mg via ORAL
  Filled 2013-12-26: qty 1

## 2013-12-26 MED ORDER — ACETAMINOPHEN 325 MG PO TABS: 650.0000 mg | ORAL_TABLET | Freq: Four times a day (QID) | ORAL | Status: DC | PRN

## 2013-12-26 MED ORDER — SODIUM CHLORIDE 0.45 % IV SOLN: INTRAVENOUS | Status: DC

## 2013-12-26 MED ORDER — ACETAMINOPHEN 650 MG RE SUPP: 650.0000 mg | Freq: Four times a day (QID) | RECTAL | Status: DC | PRN

## 2013-12-26 NOTE — H&P (Signed)
Family Medicine Teaching [facility] Admission History and Physical Service Pager: [phone]  Patient name: Brendan Abbott Medical record number: [id number] Date of birth: [birth year] Age: 48 y.o. Gender: male  Primary Care Provider: Maryjean Ka, MD Consultants: Orthopedic surgery Code Status: Full code  Chief Complaint: Elbow pain  Assessment and Plan: Brendan Abbott is a 48 y.o. male presenting with left elbow swelling and pain. PMH is significant for hypertension and mental retardation.  # Left elbow swelling and pain: Patient with large bone spur per x-ray on 8/15. With worsening symptoms and physical findings, including inability to fully extend joint, there is concern for septic joint. This could also be aseptic in nature. He has not had any systemic symptoms so far. - admit to inpatient, attending Dr. Gwendolyn Grant - consult orthopedic surgery (Dr. Ladona Ridgel on call today), recommendations appreciated  - Will see patient in AM  - NPO after midnight  - consult IR for aspiration: gram culture, cell count, culture - vancomycin after aspiration obtained - ibuprofen for pain as needed  # Hypertension - continue metoprolol, lisinopril, hctz  # Mental retardation - HCPOA: Hurley Cisco [phone] - Group home contacts (First line for updates)  Kelli Churn: [phone]  Garen Lah: [phone]  FEN/GI: 1/2NS @100ml /hr Prophylaxis: Heparin subq  Disposition: Admit to inpatient.  History of Present Illness: Brendan Abbott is a 48 y.o. male presenting with swollen and warm left elbow. History provided by caregiver, Kelli Churn. Symptoms started over the weekend with just swelling and pain, 4 days ago while at his sister's home. He was taken to urgent care, found to have a large olecranon bone spur and treated for epitrochlear bursitis with NSAIDs. His swelling worsened and his elbow started to become warm with persistent pain. His caregiver has also noted the  area looks a little pink. Patient has had a similar presentation in April with concern for septic joint. Arthrocentesis attempted without results at that time and symptoms resolved with antiinflammatory medications. He has no fevers, chills, or vomiting.   Review Of Systems: Per HPI with the following additions: None Otherwise 12 point review of systems was performed and was unremarkable.  Patient Active Problem List   Diagnosis Date Noted  . Left knee pain 08/22/2013  . Seasonal allergies 05/16/2013  . Xerosis of skin 04/25/2013  . 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 UNSPECIFIED URINARY INCONTINENCE 07/11/2008  . 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 OBESITY 05/19/2007  . MENTAL RETARDATION 07/07/2006  . HYPERTENSION, BENIGN SYSTEMIC 07/07/2006   Past Medical History: Past Medical History  Diagnosis Date  . Hypertension   . MR (mental retardation)    Past Surgical History: No past surgical history on file. Social History: History  Substance Use Topics  . Smoking status: Never Smoker   . Smokeless tobacco: Not on file  . Alcohol Use: No   Additional social history: None  Please also refer to relevant sections of EMR.  Family History: No family history on file. Allergies and Medications: No Known Allergies No current facility-administered medications on file prior to encounter.   Current Outpatient Prescriptions on File Prior to Encounter  Medication Sig Dispense Refill  . aspirin (QC CHILDRENS ASPIRIN) 81 MG chewable tablet Chew 1 tablet (81 mg total) by mouth daily.  30 tablet  6  . diclofenac (CATAFLAM) 50 MG tablet Take 1 tablet (50 mg total) by mouth 3 (three) times daily. For elbow pain  21 tablet  0  . hydrochlorothiazide (HYDRODIURIL) 25 MG  tablet Take 1 tablet (25 mg total) by mouth daily.  30 tablet  5  . lisinopril (PRINIVIL) 10 MG tablet Take 1 tablet (10 mg total) by mouth daily.  30 tablet  3  . metoprolol (LOPRESSOR) 100 MG tablet Take 1.5 tablets (150 mg  total) by mouth 2 (two) times daily.  90 tablet  3  . triamcinolone cream (KENALOG) 0.1 % Apply 1 application topically daily.        Objective: BP 117/85  Pulse 97  Temp(Src) 98.3 F (36.8 C) (Oral)  Resp 16  SpO2 99%  Exam: General: Well developed, no distress Cardiovascular: Regular rate and rhythm, no murmur Respiratory: Clear to auscultation bilaterally Abdomen: Soft, non-tender, non-distended Extremities: Left elbow with significant warmth, some swelling in and around the joint, cannot appreciate much erythema, patient does not fully extend of flex his elbow joint. There appears to be some tenderness on dosral aspect of proximal forearm Skin: No cyanosis Neuro: Alert  Labs and Imaging: None   Jacquelin Hawkingalph Monzerrath Mcburney, MD 12/26/2013, 6:07 PM PGY-2, [facility] FPTS Intern pager: [phone], text pages welcome

## 2013-12-26 NOTE — Progress Notes (Signed)
   Patient ID: Brendan Abbott, male DOB: 1966-03-30, 48 y.o. MRN: [id number]  Patient seen in office for left elbow swelling and warmth. Patient was evaluated and deemed appropriate for inpatient admission for concern of septic joint. Please see admission H&P for more details regarding presentation and management of this patient.

## 2013-12-26 NOTE — Consult Note (Signed)
ORTHOPAEDIC CONSULTATION  REQUESTING PHYSICIAN: Alveda Reasons, MD  Chief Complaint: left elbow pain  HPI: Brendan Abbott is a 48 y.o. male who complains of  Left elbow pain, he has mod/severe mental retardation so history is limited. I am unable to ascertain how long it has hurt  Past Medical History  Diagnosis Date  . Hypertension   . MR (mental retardation)    No past surgical history on file. History   Social History  . Marital Status: Single    Spouse Name: N/A    Number of Children: N/A  . Years of Education: N/A   Social History Main Topics  . Smoking status: Never Smoker   . Smokeless tobacco: Not on file  . Alcohol Use: No  . Drug Use: No  . Sexual Activity: Not on file   Other Topics Concern  . Not on file   Social History Narrative  . No narrative on file   No family history on file. No Known Allergies Prior to Admission medications   Medication Sig Start Date End Date Taking? Authorizing Provider  aspirin 81 MG chewable tablet Chew 81 mg by mouth daily. 08/21/12  Yes Luetta Nutting, DO  hydrochlorothiazide (HYDRODIURIL) 25 MG tablet Take 25 mg by mouth daily. 10/08/13  Yes Sharon Mt Street, MD  lisinopril (PRINIVIL,ZESTRIL) 10 MG tablet Take 10 mg by mouth daily. 12/20/13  Yes Sharon Mt Street, MD  metoprolol (LOPRESSOR) 100 MG tablet Take 150 mg by mouth 2 (two) times daily. 11/13/13  Yes Sharon Mt Street, MD  triamcinolone cream (KENALOG) 0.1 % Apply 1 application topically 2 (two) times daily. Apply to affected area twice a day.   Yes Historical Provider, MD  diclofenac (CATAFLAM) 50 MG tablet Take 1 tablet (50 mg total) by mouth 3 (three) times daily. For elbow pain 12/22/13   Billy Fischer, MD   No results found.  Positive ROS: All other systems have been reviewed and were otherwise negative with the exception of those mentioned in the HPI and as above.  Labs cbc  Recent Labs  12/26/13 1910  WBC 5.0  HGB 16.6  HCT 51.1  PLT 193      Labs inflam No results found for this basename: ESR, CRP,  in the last 72 hours  Labs coag No results found for this basename: INR, PT, PTT,  in the last 72 hours   Recent Labs  12/26/13 1910  NA 138  K 3.7  CL 96  CO2 25  GLUCOSE 145*  BUN 17  CREATININE 1.00  CALCIUM 9.8    Physical Exam: Filed Vitals:   12/26/13 1958  BP: 150/96  Pulse: 99  Temp: 97.4 F (36.3 C)  Resp:    General: Alert, no acute distress Cardiovascular: No pedal edema Respiratory: No cyanosis, no use of accessory musculature GI: No organomegaly, abdomen is soft and non-tender Skin: No lesions in the area of chief complaint other than those listed below in MSK exam.  Neurologic: Sensation intact distally Psychiatric: limited by mental retardation Lymphatic: No axillary or cervical lymphadenopathy  MUSCULOSKELETAL:  LUE: minimal swelling at left elbow, no bursal inflamation and no palpable effusion. ROM is limited but small arc motion is painless and he has no TTP at the elbow. Distally he is NVI and compartments are soft Other extremities are atraumatic with painless ROM and NVI.  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 My clinical suspicion for infection is extremely low. He can follow up with me in 2-4wks to see how his arthritis is going.   I have cancelled his MRI and gave him a diet.  Weight Bearing Status: WBAT, sling for comfort.   I will sign off at this time.    Edmonia Lynch, D, MD Cell [phone]   12/26/2013 9:50 PM

## 2013-12-26 NOTE — Progress Notes (Signed)
Orthopedic Tech Progress Note Patient Details:  Ronelle Nigherry L Creamer 07-Dec-1965 [id number]  Ortho Devices Type of Ortho Device: Arm sling;Ace wrap Ortho Device/Splint Location: lue Ortho Device/Splint Interventions: Application viewe order from doctor's order list  Nikki DomCrawford, Will Schier 12/26/2013, 8:27 PM

## 2013-12-26 NOTE — Progress Notes (Deleted)
   Subjective:    Patient ID: Brendan Abbott, male    DOB: 12-28-1965, 48 y.o.   MRN: [id number]  HPI  Care giver Kelli ChurnMichelle Liggins  With his sister over the weekend. Left elbow swollen. Taken to urgent care. Found to have a bone spur. Between Sunday and today, it has gotten bigger, pink, warm and tender. No fever. No nausea or vomiting. Has a recent history with knee that was similar last April and resolved on its own. States that he fell at his sister's house but he is a poor historian. Has been given Aleve and prescribed medication (unsure of which medication)  Review of Systems     Objective:   Physical Exam        Assessment & Plan:

## 2013-12-27 DIAGNOSIS — I10 Essential (primary) hypertension: Secondary | ICD-10-CM | POA: Diagnosis not present

## 2013-12-27 DIAGNOSIS — M25529 Pain in unspecified elbow: Secondary | ICD-10-CM | POA: Diagnosis not present

## 2013-12-27 DIAGNOSIS — F79 Unspecified intellectual disabilities: Secondary | ICD-10-CM | POA: Diagnosis not present

## 2013-12-27 DIAGNOSIS — M25429 Effusion, unspecified elbow: Secondary | ICD-10-CM | POA: Diagnosis not present

## 2013-12-27 DIAGNOSIS — E669 Obesity, unspecified: Secondary | ICD-10-CM | POA: Diagnosis not present

## 2013-12-27 DIAGNOSIS — M25522 Pain in left elbow: Secondary | ICD-10-CM | POA: Diagnosis present

## 2013-12-27 DIAGNOSIS — M19029 Primary osteoarthritis, unspecified elbow: Secondary | ICD-10-CM | POA: Diagnosis not present

## 2013-12-27 DIAGNOSIS — M898X9 Other specified disorders of bone, unspecified site: Secondary | ICD-10-CM | POA: Diagnosis not present

## 2013-12-27 LAB — COMPREHENSIVE METABOLIC PANEL
ALBUMIN: 3.4 g/dL — AB (ref 3.5–5.2)
ALK PHOS: 75 U/L (ref 39–117)
ALT: 13 U/L (ref 0–53)
AST: 16 U/L (ref 0–37)
Anion gap: 14 (ref 5–15)
BILIRUBIN TOTAL: 0.9 mg/dL (ref 0.3–1.2)
BUN: 20 mg/dL (ref 6–23)
CHLORIDE: 99 meq/L (ref 96–112)
CO2: 27 mEq/L (ref 19–32)
Calcium: 9 mg/dL (ref 8.4–10.5)
Creatinine, Ser: 0.94 mg/dL (ref 0.50–1.35)
GFR calc Af Amer: >90 mL/min (ref >90)
GFR calc non Af Amer: >90 mL/min (ref >90)
Glucose, Bld: 114 mg/dL — ABNORMAL HIGH (ref 70–99)
POTASSIUM: 3.7 meq/L (ref 3.7–5.3)
Sodium: 140 mEq/L (ref 137–147)
Total Protein: 7.4 g/dL (ref 6.0–8.3)

## 2013-12-27 LAB — CBC
HCT: 48.5 % (ref 39.0–52.0)
Hemoglobin: 15.6 g/dL (ref 13.0–17.0)
MCH: 25.9 pg — ABNORMAL LOW (ref 26.0–34.0)
MCHC: 32.2 g/dL (ref 30.0–36.0)
MCV: 80.4 fL (ref 78.0–100.0)
Platelets: 221 10*3/uL (ref 150–400)
RBC: 6.03 MIL/uL — ABNORMAL HIGH (ref 4.22–5.81)
RDW: 14.4 % (ref 11.5–15.5)
WBC: 5.9 10*3/uL (ref 4.0–10.5)

## 2013-12-27 MED ORDER — MELOXICAM 15 MG PO TABS: 15.0000 mg | ORAL_TABLET | Freq: Every day | ORAL | Status: AC

## 2013-12-27 NOTE — Discharge Summary (Signed)
Family Medicine Teaching [facility]  Hospital Discharge Summary  Patient name: Brendan Abbott Medical record number: [id number]  Date of birth: 11-11-1965 Age: 48 y.o. Gender: male  Date of Admission: 12/26/2013 Date of Discharge: 12/27/2013  Admitting Physician: Tobey GrimJeffrey H Walden, MD  Primary Care Provider: Maryjean KaStreet, Christopher, MD  Consultants: Ortho  Indication for Hospitalization: swollen elbow  Discharge Diagnoses/Problem List:  Patient Active Problem List    Diagnosis  Date Noted   .  Left elbow pain  12/27/2013   .  MENTAL RETARDATION  07/07/2006   .  HYPERTENSION, BENIGN SYSTEMIC  07/07/2006   Disposition: discharge to group home  Discharge Condition: improved/stable  Discharge Exam:  BP 108/67  Pulse 82  Temp(Src) 97.9 F (36.6 C) (Oral)  Resp 16  SpO2 97%  General: Well developed, no distress  Cardiovascular: Regular rate and rhythm, no murmur  Respiratory: Clear to auscultation bilaterally  Abdomen: Soft, non-tender, non-distended  Extremities: Left elbow initially in sling and ACE wrap; unwrapped significant warmth, pt not interested in extended arm fully; no palpable effusion, pulse equal and symmetric bilat  Skin: No cyanosis or skin changes  Neuro: Alert, conversant, looks to sister for guidance  Brief Hospital Course:  Brendan Iona BeardHaith is a 48 y.o. male presenting with left elbow swelling and pain. PMH is significant for hypertension and mental retardation.  # Left elbow swelling and pain: Hx provided by caregiver Brendan Abbott, who stated that pain and swelling began approx 4 days ago. Patient with large bone spur per x-ray on 8/15. With worsening symptoms and physical findings, including inability to fully extend joint, there was concern for septic joint at [facility] office. Pt sent over to hospital for potential MRI and tap of joint. Seen by Dr. Eulah PontMurphy who believed it was aseptic, arthritic inflammation. Will obtain short course of mobic and f/up in office in 2 weeks. Pt given sling,  ace wrap on discharge. Recommended RICE.  # Hypertension Continued metoprolol, lisinopril, hctz; BPs 100s-150s/60-90s. Could consider decreasing on f/up  # Mental retardation Above information discussed with sister. Family in agreement. [facility].  - HCPOHurley Abbott: Brendan Abbott [phone]  - Group home contacts (First line for updates)  Brendan Abbott: [phone]) [phone]  Brendan Abbott: [phone](336) [phone]  Issues for Follow Up:  1. PT at group home to improve mobility  2. Short course of anti-inflammatories- GI side effects?  3. Could potentially back off on HTN meds  Significant Procedures: none  Significant Labs and Imaging:   Recent Labs  Lab  12/26/13 1910  12/27/13 0500   WBC  5.0  5.9   HGB  16.6  15.6   HCT  51.1  48.5   PLT  193  221     Recent Labs  Lab  12/26/13 1910  12/27/13 0500   NA  138  140   K  3.7  3.7   CL  96  99   CO2  25  27   GLUCOSE  145*  114*   BUN  17  20   CREATININE  1.00  0.94   CALCIUM  9.8  9.0   ALKPHOS  79  75   AST  19  16   ALT  14  13   ALBUMIN  3.8  3.4*    12/22/13  XR elbow  IMPRESSION:  Bulky olecranon spur.  No definite acute osseous findings  Results/Tests Pending at Time of Discharge: none  Discharge Medications:  Medication List     STOP taking these medications       diclofenac 50 MG tablet    Commonly known as: CATAFLAM     TAKE these medications       aspirin 81 MG chewable tablet    Chew 81 mg by mouth daily.    hydrochlorothiazide 25 MG tablet    Commonly known as: HYDRODIURIL    Take 25 mg by mouth daily.    lisinopril 10 MG tablet    Commonly known as: PRINIVIL,ZESTRIL    Take 10 mg by mouth daily.    meloxicam 15 MG tablet    Commonly known as: MOBIC    Take 1 tablet (15 mg total) by mouth daily.    metoprolol 100 MG tablet    Commonly known as: LOPRESSOR    Take 150 mg by mouth 2 (two) times daily.    triamcinolone cream 0.1 %    Commonly known as: KENALOG    Apply 1  application topically 2 (two) times daily. Apply to affected area twice a day.      Discharge Instructions: Please refer to Patient Instructions section of EMR for full details. Patient was counseled important signs and symptoms that should prompt return to medical care, changes in medications, dietary instructions, activity restrictions, and follow up appointments.  Follow-Up Appointments:      Follow-up Information    Follow up with Saralyn Pilar, DO On 01/04/2014. (@3pm )    Specialty: Osteopathic Medicine    Contact information:    [address] [place] [address]  [phone]      Charlane Ferretti, MD  12/27/2013, 9:12 AM  PGY-2, [facility] Family Medicine  The above note was copied verbatim from Dr. Nyra Capes original note (which was filed in error as an H&P). No changes were made to its contents.  Bobbye Morton, MD PGY-3, [facility] Family Medicine 12/27/2013, 4:25 PM

## 2013-12-27 NOTE — Care Management Note (Signed)
CARE MANAGEMENT NOTE 12/27/2013  Patient:  Ronelle NighHAITH,Odell L   Account Number:  [id number]  Date Initiated:  12/27/2013  Documentation initiated by:  Vance PeperBRADY,Daulton Harbaugh  Subjective/Objective Assessment:   48 yr old admitted with left septic elbow.     Action/Plan:   Patient has MR, lives in a group home-[facility]. Case Manager contacted care provider Marcelino DusterMichelle @ [phone] to discuss home health agency. Choice offered. [facility].   Anticipated DC Date:  12/27/2013   Anticipated DC Plan:  GROUP HOME      DC Planning Services  CM consult      [facility]  [facility]   Choice offered to / List presented to:  C-2 HC POA / Guardian   DME arranged  NA        HH arranged  HH-2 PT      HH agency  [facility]   Status of service:  Completed, signed off Medicare Important Message given?  NA - LOS <3 / Initial given by admissions (If response is "NO", the following Medicare IM given date fields will be blank) Date Medicare IM given:   Medicare IM given by:   Date Additional Medicare IM given:   Additional Medicare IM given by:    Discharge Disposition:  HOME W HOME HEALTH SERVICES  Per UR Regulation:  Reviewed for med. necessity/level of care/duration of stay

## 2013-12-27 NOTE — H&P (Signed)
This note filed in error as an H&P. Copied verbatim without changes / additions into a discharge summary and cosigned to Dr. Gwendolyn Grant. Disregard this note. Bobbye Morton, MD PGY-3, [facility] Family Medicine 12/27/2013, 4:26 PM  Family Medicine Teaching [facility] Discharge Summary  Patient name: Brendan Abbott Medical record number: [id number] Date of birth: [birth year] Age: 48 y.o. Gender: male Date of Admission: 12/26/2013  Date of Discharge: 12/27/2013 Admitting Physician: Tobey Grim, MD  Primary Care Provider: Maryjean Ka, MD Consultants: Ortho  Indication for Hospitalization: swollen elbow   Discharge Diagnoses/Problem List:  Patient Active Problem List   Diagnosis Date Noted  . Left elbow pain 12/27/2013  . MENTAL RETARDATION 07/07/2006  . HYPERTENSION, BENIGN SYSTEMIC 07/07/2006    Disposition: discharge to group home   Discharge Condition: improved/stable  Discharge Exam:  BP 108/67  Pulse 82  Temp(Src) 97.9 F (36.6 C) (Oral)  Resp 16  SpO2 97% General: Well developed, no distress  Cardiovascular: Regular rate and rhythm, no murmur  Respiratory: Clear to auscultation bilaterally  Abdomen: Soft, non-tender, non-distended  Extremities: Left elbow initially in sling and ACE wrap; unwrapped significant warmth, pt not interested in extended arm fully; no palpable effusion, pulse equal and symmetric bilat  Skin: No cyanosis or skin changes Neuro: Alert, conversant, looks to sister for guidance   Brief Hospital Course:  Brendan Abbott is a 48 y.o. male presenting with left elbow swelling and pain. PMH is significant for hypertension and mental retardation.  # Left elbow swelling and pain: Hx provided by caregiver Kelli Churn, who stated that pain and swelling began approx 4 days ago. Patient with large bone spur per x-ray on 8/15. With worsening symptoms and physical findings, including inability to fully extend joint, there was concern for septic  joint at [facility] office. Pt sent over to hospital for potential MRI and tap of joint. Seen by Dr. Eulah Pont who believed it was aseptic, arthritic inflammation. Will obtain short course of mobic and f/up in office in 2 weeks. Pt given sling, ace wrap on discharge. Recommended RICE.   # Hypertension Continued metoprolol, lisinopril, hctz; BPs 100s-150s/60-90s. Could consider decreasing on f/up   # Mental retardation Above information discussed with sister. Family in agreement. Will obtain [facility] PT services on discharge.  - HCPOAHurley Cisco [phone]  - Group home contacts (First line for updates)  Kelli Churn: [phone]  Garen Lah: [phone]  Issues for Follow Up:  1. PT at group home to improve mobility 2. Short course of anti-inflammatories- GI side effects? 3. Could potentially back off on HTN meds  Significant Procedures: none  Significant Labs and Imaging:   Recent Labs Lab 12/26/13 1910 12/27/13 0500  WBC 5.0 5.9  HGB 16.6 15.6  HCT 51.1 48.5  PLT 193 221    Recent Labs Lab 12/26/13 1910 12/27/13 0500  NA 138 140  K 3.7 3.7  CL 96 99  CO2 25 27  GLUCOSE 145* 114*  BUN 17 20  CREATININE 1.00 0.94  CALCIUM 9.8 9.0  ALKPHOS 79 75  AST 19 16  ALT 14 13  ALBUMIN 3.8 3.4*    12/22/13 XR elbow IMPRESSION:  Bulky olecranon spur.  No definite acute osseous findings   Results/Tests Pending at Time of Discharge: none  Discharge Medications:    Medication List    STOP taking these medications       diclofenac 50 MG tablet  Commonly known as:  CATAFLAM  TAKE these medications       aspirin 81 MG chewable tablet  Chew 81 mg by mouth daily.     hydrochlorothiazide 25 MG tablet  Commonly known as:  HYDRODIURIL  Take 25 mg by mouth daily.     lisinopril 10 MG tablet  Commonly known as:  PRINIVIL,ZESTRIL  Take 10 mg by mouth daily.     meloxicam 15 MG tablet  Commonly known as:  MOBIC  Take 1 tablet (15 mg total) by mouth  daily.     metoprolol 100 MG tablet  Commonly known as:  LOPRESSOR  Take 150 mg by mouth 2 (two) times daily.     triamcinolone cream 0.1 %  Commonly known as:  KENALOG  Apply 1 application topically 2 (two) times daily. Apply to affected area twice a day.        Discharge Instructions: Please refer to Patient Instructions section of EMR for full details.  Patient was counseled important signs and symptoms that should prompt return to medical care, changes in medications, dietary instructions, activity restrictions, and follow up appointments.   Follow-Up Appointments:     Follow-up Information   Follow up with Saralyn PilarKaramalegos, Alexander, DO On 01/04/2014. (@3pm )    Specialty:  Osteopathic Medicine   Contact information:   [address] [place] [address] [phone]       Charlane FerrettiMelanie C Marsh, MD 12/27/2013, 9:12 AM PGY-2, [facility]

## 2013-12-27 NOTE — H&P (Signed)
FMTS Attending Admission Note Patient seen and examined on day of admission with admitting resident, I have discussed the plan of care and agree with Dr Dennison NancyNettey's assessment.  Briefly, patient with cognitive impairment [facility] pain and inflammation of left elbow, for which he was recently seen in the ED.  Caregiver states that since that time, the patient has been unable to extend the left elbow and has complained of pain.  It is unclear whether he has had an episode of trauma that preceding the symptom onset.  On exam the left elbow is tender, erythematous and warm, with induration over the olecranon process. Limited active ROM (extension).  Palpable radial pulse and full strength/ROM left hand.   X-ray of L elbow from ED visit reviewed by me during visit.   A/P: L elbow pain with signs of inflammation, limited ROM raises concern for intra-articular process. Given the timing of his presentation to the outpatient setting, decision to direct-admit with Orthopedics consult for evaluation. Abx coverage and laboratory assessment in the meantime.  Paula ComptonJames Cozetta Seif, MD

## 2013-12-27 NOTE — Discharge Planning (Signed)
[facility] group [facility].

## 2013-12-27 NOTE — Clinical Social Work Psychosocial (Signed)
Clinical Social Work Department BRIEF PSYCHOSOCIAL ASSESSMENT 12/27/2013  Patient:  Brendan Abbott, Brendan Abbott     Account Number:  [id number]     Admit date:  12/26/2013  Clinical Social Worker:  Wylene Men  Date/Time:  12/27/2013 10:37 AM  Referred by:  Physician  Date Referred:  12/27/2013 Referred for  SNF Placement   Other Referral:   Interview type:  Other - See comment Other interview type:   [place] and Group Home Director Brendan Abbott    PSYCHOSOCIAL DATA Living Status:  FACILITY Admitted from facility:  Other Level of care:  Family Care Home Primary support name:  Brendan Abbott Primary support relationship to patient:  FAMILY Degree of support available:   adequate    CURRENT CONCERNS Current Concerns  Post-Acute Placement   Other Concerns:   transportation to group home    [place] / PLAN CSW met with pt at bedside.  Pt was pleasant and alert.  Pt was unable to participate in assessment.  CSW contacted Brendan Abbott, pt aunt and guardian.  Aunt reports that pt is [facility] in [place] where he has been a resident for years.  Brendan Abbott reports that she is happy with the care that the pt receives at [facility]. Brendan Abbott states that pt has had left arm pain for a while which has been difficult to manage because pt cannot often verbalize what's wrong.  CSW offered support.    RNCM was consulted to set up [place] PT per MD order. RNCM agreeable.  CSW arranged transportation via [place] Brendan Abbott [phone]) for 12:30-1pm today.  Brendan Abbott, Group Home Director states that hard scripts are needed for all new scripts.  RN made aware.MD notified.   Assessment/plan status:  Psychosocial Support/Ongoing Assessment of Needs Other assessment/ plan:   none   Information/referral to community resources:   [place]- home health PT/OT    PATIENT'S/FAMILY'S RESPONSE TO PLAN OF CARE: Pt aunt Brendan Abbott is agreeable for  pt to return to [facility].  Brendan Abbott and Brendan Abbott ([employer]) were very appreciative of CSW assistance and support.       Nonnie Done, [place] [phone]  Clinical Social Work

## 2013-12-27 NOTE — Discharge Instructions (Signed)
Mr Brendan Abbott,   You were seen in the hospital for pain and swelling in your elbow.   We monitored you closely overnight for signs of infection. We were pleased to see that you had no fevers and felt overall well.  Please continue to take an anti-inflammatory medicine to help with swellling.  Make sure you also move your arm so that it doesn't get too stiff.  If you experience fever, chills, nausea or vomiting please call right away.  Feel better soon!

## 2013-12-27 NOTE — Progress Notes (Signed)
[facility]  Patient Status: New  [facility] is providing the following services: PT  If patient discharges after hours, please call [phone].   Brendan Abbott 12/27/2013, 3:58 PM  Referral received by Jodene NamMary Hickling from Vance PeperSusan Brady, CM

## 2013-12-27 NOTE — Clinical Social Work Note (Signed)
[facility] RN to call report to: Marcelino DusterMichelle Sales promotion account executive(Director of [facility]) [phone] Transportation: provided by [facility] notified by CSW: Armando Reichertunt Naomi Stewart   Gina Alston Berrie, LCSWA [phone]  Clinical Social Work

## 2013-12-27 NOTE — Progress Notes (Signed)
Family Medicine PGY-3 PCP Note  I have been directly involved in Brendan Abbott's inpatient care as part of the current FPTS inpatient team, and will plan to see him in outpatient follow-up as well; please see his H&P, daily progress, and discharge summary notes for details of his care. I appreciate the assistance of all consultant, nursing, and other ancillary staff on behalf of my patient.  Brendan Mortonhristopher M Omunique Pederson, MD PGY-3, [facility] 12/27/2013, 9:53 AM First call: FPTS Service pager: [phone] (text pages welcome through [facility])

## 2013-12-29 NOTE — H&P (Signed)
Family Medicine Teaching Service  Discharge Note : Attending Jeff Deundra Bard MD Pager [phone] Inpatient Team Pager:  [phone]  I have reviewed this patient and the patient's chart and have discussed discharge planning with the resident at the time of discharge. I agree with the discharge plan as above.    

## 2013-12-30 NOTE — Discharge Summary (Signed)
Family Medicine Teaching Service  Discharge Note : Attending Renold Don MD Pager [phone] Inpatient Team Pager:  [phone]  I have reviewed this patient and the patient's chart and have discussed discharge planning with the resident at the time of discharge. I agree with the discharge plan as above.  Spoke with Orthopedist Dr. Eulah Pont over the phone.  He thinks it more likely Brendan Abbott was suffering from arthritis rather than infection or bursitis.  Mr. Claiborne will follow up with Dr. Eulah Pont in the outpatient setting.

## 2014-01-04 ENCOUNTER — Ambulatory Visit (INDEPENDENT_AMBULATORY_CARE_PROVIDER_SITE_OTHER): Payer: Medicare Other | Admitting: Family Medicine

## 2014-01-04 ENCOUNTER — Encounter: Payer: Self-pay | Admitting: Family Medicine

## 2014-01-04 VITALS — BP 112/79 | HR 80 | Temp 98.2°F | Wt 193.6 lb

## 2014-01-04 DIAGNOSIS — M25529 Pain in unspecified elbow: Secondary | ICD-10-CM | POA: Diagnosis not present

## 2014-01-04 DIAGNOSIS — F79 Unspecified intellectual disabilities: Secondary | ICD-10-CM | POA: Diagnosis not present

## 2014-01-04 DIAGNOSIS — M25429 Effusion, unspecified elbow: Secondary | ICD-10-CM | POA: Diagnosis not present

## 2014-01-04 DIAGNOSIS — M25522 Pain in left elbow: Secondary | ICD-10-CM

## 2014-01-04 NOTE — Assessment & Plan Note (Signed)
Resolved L-elbow pain. Suspected due to arthritis / bursitis flare, in setting of known olecranon bone spur - Non-tender, no effusion, no erythema, afebrile, normal L-elbow exam with FROM  Plan: 1. Complete Mobic  daily course - (total 2 weeks), then discontinue. Do not recommend to resume unless recurrent flares, then re-evaluate and consider prolonged course 2. Continue conservative therapy 3. Follow-up with Ortho (Dr. Eulah Pont) as scheduled after hospital discharge 4. RTC PRN

## 2014-01-04 NOTE — Progress Notes (Signed)
Subjective:     Patient ID: Brendan Abbott, male   DOB: Jun 06, 1965, 48 y.o.   MRN: [id number]  History provided by caregiver from patient's group home.  HPI  Hospital Follow-up (admitted 8/19, discharged 8/20 - for Left Elbow Pain / Swelling)  Left Elbow Pain / Swelling: (From Chart Review): Presents for follow-up after recent hospitalization due to worsening Left elbow pain and swelling, previously seen at Urgent Care, L-elbow X-rays with "bulky olecranon bone spur" suggestive of arthritis, during hospitalization evaluated by Orthopedics, believed L-elbow was not septic, but due to inflammation from arthritis, 2 week Mobic course, sling, wrap, and ACE. Joint was not tapped. - Currently presents with completely resolved symptoms. Caregiver reports patient is now without any elbow or arm swelling, no redness, and does not appear to be in pain. He is fully functional with left arm, actively uses and bends it, previously would avoid using it. - Denies fevers/chills, other joint pain or swelling, rash, nausea / vomiting  PMH: - History of intellectual disability, currently living in a group home  I have reviewed and updated the following as appropriate: allergies and current medications  Social Hx: - Never smoker  Review of Systems  See above HPI    Objective:   Physical Exam  BP 112/79  Pulse 80  Temp(Src) 98.2 F (36.8 C) (Oral)  Wt 193 lb 9.6 oz (87.816 kg)  Gen - well-appearing, non-verbal, NAD MSK - Left elbow: non-tender to palpation, no edema or effusion, no erythema, full active ROM, additionally left forearm non-tender and left shoulder with FROM. Right elbow normal appearing, FROM, symmetrical Ext - non-tender, no edema, peripheral pulses intact +2 b/l Skin - warm, dry, no rashes Neuro - awake, alert, grossly non-focal, intact muscle strength 5/5 b/l, gait normal     Assessment:     See specific A&P problem list for details.      Plan:     See specific A&P problem  list for details.

## 2014-01-04 NOTE — Patient Instructions (Signed)
Thank you for bringing Brendan Abbott into the clinic.  At this follow-up appointment, we re-evaluated his Left Elbow Pain and Swelling from recent hospitalization, likely due to acute bursitis flare.  Currently it looks like his symptoms have resolved. No longer has pain or swelling in Left Elbow.  Recommend to finish the previously prescribed Meloxicam (Mobic)  daily PO course, and then discontinue this Mobic course upon completion.  Advise for patient to be re-evaluated at the clinic if pain and swelling of Left Elbow or other joints returns.  Please schedule a follow-up appointment with Dr. Casper Harrison as needed in 6 to 12 months for follow-up.  If you have any other questions or concerns, please feel free to call the clinic to contact me. You may also schedule an earlier appointment if necessary.  However, if your symptoms get significantly worse, please go to the Emergency Department to seek immediate medical attention.  Saralyn Pilar, DO [facility]   Olecranon Bursitis Bursitis is swelling and soreness (inflammation) of a fluid-filled sac (bursa) that covers and protects a joint. Olecranon bursitis occurs over the elbow.  CAUSES Bursitis can be caused by injury, overuse of the joint, arthritis, or infection.  SYMPTOMS   Tenderness, swelling, warmth, or redness over the elbow.  Elbow pain with movement. This is greater with bending the elbow.  Squeaking sound when the bursa is rubbed or moved.  Increasing size of the bursa without pain or discomfort.  Fever with increasing pain and swelling if the bursa becomes infected. HOME CARE INSTRUCTIONS   Put ice on the affected area.  Put ice in a plastic bag.  Place a towel between your skin and the bag.  Leave the ice on for 15-20 minutes each hour while awake. Do this for the first 2 days.  When resting, elevate your elbow above the level of your heart. This helps reduce swelling.  Continue to put  the joint through a full range of motion 4 times per day. Rest the injured joint at other times. When the pain lessens, begin normal slow movements and usual activities.  Only take over-the-counter or prescription medicines for pain, discomfort, or fever as directed by your caregiver.  Reduce your intake of milk and related dairy products (cheese, yogurt). They may make your condition worse. SEEK IMMEDIATE MEDICAL CARE IF:   Your pain increases even during treatment.  You have a fever.  You have heat and inflammation over the bursa and elbow.  You have a red line that goes up your arm.  You have pain with movement of your elbow. MAKE SURE YOU:   Understand these instructions.  Will watch your condition.  Will get help right away if you are not doing well or get worse. Document Released: 05/26/2006 Document Revised: 07/19/2011 Document Reviewed: 04/11/2007 [facility] Patient Information 2015 [place], [place]. This information is not intended to replace advice given to you by your health care provider. Make sure you discuss any questions you have with your healthJakayden Cancioovider.

## 2014-01-23 ENCOUNTER — Ambulatory Visit: Payer: Medicare Other | Admitting: Family Medicine

## 2014-03-06 ENCOUNTER — Telehealth: Payer: Self-pay | Admitting: Family Medicine

## 2014-03-06 NOTE — Telephone Encounter (Signed)
Called group home back and spoke to caregiver. Will leave letter at front desk to serve as discontinuation order. Caregiver voiced understanding. --CMS

## 2014-03-06 NOTE — Telephone Encounter (Signed)
Caregiver calling regarding discontinuance of triamcinolone 80 gm cream.  Patient no longer using this medication. Please call caregiver back.  Need to have a note that shows medication has been discontinued for Group Home records and audit of Medicare.  Can leave up front and will pick up.

## 2014-03-29 ENCOUNTER — Other Ambulatory Visit: Payer: Self-pay | Admitting: *Deleted

## 2014-03-29 MED ORDER — METOPROLOL TARTRATE 100 MG PO TABS: 150.0000 mg | ORAL_TABLET | Freq: Two times a day (BID) | ORAL | Status: DC

## 2014-04-09 ENCOUNTER — Encounter: Payer: Self-pay | Admitting: Family Medicine

## 2014-04-09 NOTE — Progress Notes (Signed)
Caregiver dropped off form to be filled out.  Please call when completed. °

## 2014-04-10 NOTE — Progress Notes (Signed)
Placed in PCP box for completion 

## 2014-04-10 NOTE — Progress Notes (Signed)
I will be out of town until Monday and will plan to fill out the form, then. Dr. Waynetta SandyWight is covering my box the rest of this week -- he can leave the form for me to fill out when I get back, or if it is needed urgently, he can contact me and we can try to completed it together over the phone. Thanks. --CMS

## 2014-04-15 NOTE — Progress Notes (Signed)
Spoke with pt's caregiver Kelli ChurnMichelle Liggins that forms are complete and ready for pick up.  Clovis PuMartin, Avanna Sowder L, RN

## 2014-04-15 NOTE — Progress Notes (Signed)
Form completed and left with Darcella Cheshire. Martin, RN. Forms related to group home stay. --CMS

## 2014-04-16 ENCOUNTER — Other Ambulatory Visit: Payer: Self-pay | Admitting: *Deleted

## 2014-04-17 MED ORDER — HYDROCHLOROTHIAZIDE 25 MG PO TABS: 25.0000 mg | ORAL_TABLET | Freq: Every day | ORAL | Status: DC

## 2014-05-01 ENCOUNTER — Other Ambulatory Visit: Payer: Self-pay | Admitting: *Deleted

## 2014-05-01 MED ORDER — LISINOPRIL 10 MG PO TABS: 10.0000 mg | ORAL_TABLET | Freq: Every day | ORAL | Status: DC

## 2014-10-10 ENCOUNTER — Other Ambulatory Visit: Payer: Self-pay | Admitting: *Deleted

## 2014-10-10 MED ORDER — METOPROLOL TARTRATE 100 MG PO TABS
150.0000 mg | ORAL_TABLET | Freq: Two times a day (BID) | ORAL | Status: DC
Start: 2014-10-10 — End: 2014-10-15

## 2014-10-15 ENCOUNTER — Telehealth: Payer: Self-pay | Admitting: *Deleted

## 2014-10-15 ENCOUNTER — Other Ambulatory Visit: Payer: Self-pay | Admitting: *Deleted

## 2014-10-15 MED ORDER — HYDROCHLOROTHIAZIDE 25 MG PO TABS: 25.0000 mg | ORAL_TABLET | Freq: Every day | ORAL | Status: DC

## 2014-10-15 MED ORDER — METOPROLOL TARTRATE 100 MG PO TABS: 150.0000 mg | ORAL_TABLET | Freq: Two times a day (BID) | ORAL | Status: DC

## 2014-10-15 NOTE — Telephone Encounter (Signed)
[facility]  Transmission failed to pharmacy on 10/10/14.  Refill resent to pharmacy.  Brendan Abbott, Shadrack Brummitt L, RN

## 2014-10-24 ENCOUNTER — Other Ambulatory Visit: Payer: Self-pay | Admitting: *Deleted

## 2014-10-24 MED ORDER — ASPIRIN 81 MG PO CHEW: 81.0000 mg | CHEWABLE_TABLET | Freq: Every day | ORAL | Status: DC

## 2014-11-04 ENCOUNTER — Other Ambulatory Visit: Payer: Self-pay | Admitting: *Deleted

## 2014-11-04 MED ORDER — LISINOPRIL 10 MG PO TABS: 10.0000 mg | ORAL_TABLET | Freq: Every day | ORAL | Status: DC

## 2015-05-23 ENCOUNTER — Encounter: Payer: Self-pay | Admitting: Internal Medicine

## 2015-05-23 ENCOUNTER — Ambulatory Visit (INDEPENDENT_AMBULATORY_CARE_PROVIDER_SITE_OTHER): Payer: Commercial Managed Care - HMO | Admitting: Internal Medicine

## 2015-05-23 VITALS — BP 93/68 | HR 78 | Temp 98.2°F | Ht 69.0 in | Wt 202.0 lb

## 2015-05-23 DIAGNOSIS — J029 Acute pharyngitis, unspecified: Secondary | ICD-10-CM | POA: Diagnosis not present

## 2015-05-23 DIAGNOSIS — I10 Essential (primary) hypertension: Secondary | ICD-10-CM

## 2015-05-23 DIAGNOSIS — F79 Unspecified intellectual disabilities: Secondary | ICD-10-CM | POA: Diagnosis not present

## 2015-05-23 NOTE — Assessment & Plan Note (Signed)
Per caregiver, this is a new complaint that patient has not mentioned before our encounter. No pharyngeal exudates or erythema on exam. Patient without any other symptoms, including cough, hoarseness, nasal congestion. Is afebrile.  - No indication to treat at this time - Return if symptoms worsen

## 2015-05-23 NOTE — Patient Instructions (Signed)
Brendan Abbott appears to be very healthy.   If you have any questions or concerns, please feel free to call the office.   Be well,  Dr. Natale MilchLancaster

## 2015-05-23 NOTE — Progress Notes (Signed)
   Subjective:    Patient ID: Brendan Abbott, male    DOB: 29-Aug-1965, 50 y.o.   MRN: [id number]  HPI  Patient presents for wellness visit.   Developmental delay One of patient's caretakers is with him and provides most of the information. He is currently living in a group home, and his caretaker says he does well. She reports that he has not had any complaints recently. His family visits him at the group home, and takes him out for day trips from time to time. Generally he seems happy.   Hypertension Currently well-controlled. Patient continues to take HCTZ and lisinopril.   Sore throat Patient complains of sore throat today, however caregiver says he has not mentioned this at home. She says he has not had any nasal congestion, rhinorrhea, coughing or sneezing. Patient cannot provide more information about his symptoms.   Review of Systems See HPI.     Objective:   Physical Exam  Constitutional: He appears well-developed and well-nourished. No distress.  HENT:  Head: Normocephalic and atraumatic.  Nose: Nose normal.  Mouth/Throat: Oropharynx is clear and moist. No oropharyngeal exudate.  Eyes: Conjunctivae and EOM are normal. Pupils are equal, round, and reactive to light. Right eye exhibits no discharge. Left eye exhibits no discharge. No scleral icterus.  Neck: Normal range of motion. Neck supple.  Cardiovascular: Normal rate, regular rhythm and normal heart sounds.   No murmur heard. Pulmonary/Chest: Effort normal and breath sounds normal. No respiratory distress. He has no wheezes.  Abdominal: Soft. Bowel sounds are normal. He exhibits no distension. There is no tenderness. There is no rebound and no guarding.  Lymphadenopathy:    He has no cervical adenopathy.  Neurological: He is alert. No cranial nerve deficit.  Skin: Skin is warm and dry.  Vitals reviewed.     Assessment & Plan:  No problem-specific assessment & plan notes found for this encounter.  Tarri AbernethyAbigail J  Tiant Peixoto, MD PGY-1 [facility]

## 2015-05-23 NOTE — Assessment & Plan Note (Signed)
Patient continues to live in group home. There is no change in his mental status.  - Completed required forms for patient to continue living in group home

## 2015-05-23 NOTE — Assessment & Plan Note (Signed)
Well-controlled. BP 93/63 today.  - Continue lisinopril and HCTZ - Obtain BMP on next visit as patient is taking lisinopril

## 2015-05-30 ENCOUNTER — Other Ambulatory Visit: Payer: Self-pay | Admitting: *Deleted

## 2015-06-01 MED ORDER — METOPROLOL TARTRATE 100 MG PO TABS: 150.0000 mg | ORAL_TABLET | Freq: Two times a day (BID) | ORAL | Status: DC

## 2015-06-01 MED ORDER — LISINOPRIL 10 MG PO TABS: 10.0000 mg | ORAL_TABLET | Freq: Every day | ORAL | Status: DC

## 2015-06-01 MED ORDER — HYDROCHLOROTHIAZIDE 25 MG PO TABS: 25.0000 mg | ORAL_TABLET | Freq: Every day | ORAL | Status: DC

## 2015-11-27 ENCOUNTER — Other Ambulatory Visit: Payer: Self-pay | Admitting: Internal Medicine

## 2015-12-22 ENCOUNTER — Other Ambulatory Visit: Payer: Self-pay | Admitting: *Deleted

## 2015-12-23 MED ORDER — ASPIRIN 81 MG PO CHEW: 81.0000 mg | CHEWABLE_TABLET | Freq: Every day | ORAL | 11 refills | Status: AC

## 2015-12-28 ENCOUNTER — Other Ambulatory Visit: Payer: Self-pay | Admitting: Internal Medicine

## 2016-01-26 ENCOUNTER — Other Ambulatory Visit: Payer: Self-pay | Admitting: Internal Medicine

## 2016-03-03 ENCOUNTER — Other Ambulatory Visit: Payer: Self-pay | Admitting: Internal Medicine

## 2016-03-06 ENCOUNTER — Ambulatory Visit (INDEPENDENT_AMBULATORY_CARE_PROVIDER_SITE_OTHER): Payer: Medicaid Other

## 2016-03-06 ENCOUNTER — Ambulatory Visit (HOSPITAL_COMMUNITY)
Admission: EM | Admit: 2016-03-06 | Discharge: 2016-03-06 | Disposition: A | Discharge status: Home / Self Care | Payer: Medicaid Other | Attending: Internal Medicine | Admitting: Internal Medicine

## 2016-03-06 ENCOUNTER — Encounter (HOSPITAL_COMMUNITY): Payer: Self-pay | Admitting: Emergency Medicine

## 2016-03-06 DIAGNOSIS — M25562 Pain in left knee: Secondary | ICD-10-CM

## 2016-03-06 DIAGNOSIS — M7052 Other bursitis of knee, left knee: Secondary | ICD-10-CM

## 2016-03-06 DIAGNOSIS — M7989 Other specified soft tissue disorders: Secondary | ICD-10-CM | POA: Diagnosis not present

## 2016-03-06 MED ORDER — DICLOFENAC POTASSIUM 50 MG PO TABS: 50.0000 mg | ORAL_TABLET | Freq: Three times a day (TID) | ORAL | 0 refills | Status: AC

## 2016-03-06 NOTE — ED Provider Notes (Signed)
CSN: [id number]     Arrival date & time 03/06/16  1214 History   First MD Initiated Contact with Patient 03/06/16 1356     Chief Complaint  Patient presents with  . Knee Pain   (Consider location/radiation/quality/duration/timing/severity/associated sxs/prior Treatment) HPI NP HISTORY FROM MOTHER 50 Y/O NON VERBAL MALE WITH HIS MOTHER, PRESENTS LIMPING LEFT LEG. HAS HISTORY OF BURSITIS. HAS BEEN USING IBUPROFEN.  MOTHER IS UNSURE OW LONG THIS HAS BEEN GOING ON BUT WOULD LIKE HER SON EVALUATED. Past Medical History:  Diagnosis Date  . Hypertension   . MR (mental retardation)    History reviewed. No pertinent surgical history. History reviewed. No pertinent family history. Social History  Substance Use Topics  . Smoking status: Never Smoker  . Smokeless tobacco: Not on file  . Alcohol use No    Review of Systems UNABLE TO OBTAIN, AS NON VERBAL  Allergies  Review of patient's allergies indicates no known allergies.  Home Medications   Prior to Admission medications   Medication Sig Start Date End Date Taking? Authorizing Provider  aspirin 81 MG chewable tablet Chew 1 tablet (81 mg total) by mouth daily. 12/23/15  Yes Marquette SaaAbigail Joseph Lancaster, MD  hydrochlorothiazide (HYDRODIURIL) 25 MG tablet TAKE 1 TABLET ONCE DAILY. 11/27/15  Yes Marquette SaaAbigail Joseph Lancaster, MD  lisinopril (PRINIVIL,ZESTRIL) 10 MG tablet TAKE 1 TABLET ONCE DAILY. 11/27/15  Yes Marquette SaaAbigail Joseph Lancaster, MD  meloxicam (MOBIC) 15 MG tablet Take 1 tablet (15 mg total) by mouth daily. 12/27/13  Yes Charlane FerrettiMelanie C Marsh, MD  metoprolol (LOPRESSOR) 100 MG tablet TAKE 1&1/2 TABLETS TWICE DAILY. 03/03/16  Yes Marquette SaaAbigail Joseph Lancaster, MD   Meds Ordered and Administered this Visit  Medications - No data to display  BP 106/89 (BP Location: Left Arm)   Pulse 92   Temp 98.6 F (37 C) (Oral)   Resp 14   SpO2 96%  No data found.   Physical Exam NURSES NOTES AND VITAL SIGNS REVIEWED. CONSTITUTIONAL: Well developed, well  nourished, no acute distress HEENT: normocephalic, atraumatic EYES: Conjunctiva normal NECK:normal ROM, supple, no adenopathy PULMONARY:No respiratory distress, normal effort ABDOMINAL: Soft, ND, NT BS+, No CVAT MUSCULOSKELETAL: Normal ROM of all extremities, LEFT KNEE, SWOLLEN TENDER TO PALPATION OVER THE PATELLA TENDON BURSA. JOINT IS STABLE.  SKIN: warm and dry without rash PSYCHIATRIC: Mood and affect, behavior are normal  Urgent Care Course   Clinical Course    Procedures (including critical care time)  Labs Review Labs Reviewed - No data to display  Imaging Review Dg Knee Complete 4 Views Left  Result Date: 03/06/2016 CLINICAL DATA:  Pain EXAM: LEFT KNEE - COMPLETE 4+ VIEW COMPARISON:  None. FINDINGS: No acute fracture. No dislocation. Spurring at the superior and inferior patella. Soft tissue swelling over the patellar tendon is noted. IMPRESSION: No acute bony pathology. Soft tissue swelling over the patellar tendon is noted suggesting bursitis. Electronically Signed   By: Jolaine ClickArthur  Hoss M.D.   On: 03/06/2016 14:42     Visual Acuity Review  Right Eye Distance:   Left Eye Distance:   Bilateral Distance:    Right Eye Near:   Left Eye Near:    Bilateral Near:         MDM   1. Acute pain of left knee   2. Infrapatellar bursitis of left knee    , Patient is reassured that there are no issues that require transfer to higher level of care at this time or additional tests. Patient is advised to continue  home symptomatic treatment. Patient is advised that if there are new or worsening symptoms to attend the emergency department, contact primary care provider, or return to UC. Instructions of care provided discharged home in stable condition.    THIS NOTE WAS GENERATED USING A VOICE RECOGNITION SOFTWARE PROGRAM. ALL REASONABLE EFFORTS  WERE MADE TO PROOFREAD THIS DOCUMENT FOR ACCURACY.  I have verbally reviewed the discharge instructions with the patient. A printed  AVS was given to the patient.  All questions were answered prior to discharge.      Tharon AquasFrank C Patrick, PA 03/06/16 [phone]

## 2016-03-06 NOTE — ED Notes (Signed)
D/c by Frank Patrick, PA  

## 2016-03-06 NOTE — ED Triage Notes (Signed)
[facility] with a complaint of left knee pain. The patient's family stated that he lives in a group home and is not able to communicate. She stated that he was walking funny and the group home staff stated that he had bursitis.

## 2016-03-26 ENCOUNTER — Other Ambulatory Visit: Payer: Self-pay | Admitting: Internal Medicine

## 2016-03-28 ENCOUNTER — Other Ambulatory Visit: Payer: Self-pay | Admitting: Internal Medicine

## 2016-04-30 ENCOUNTER — Telehealth: Payer: Self-pay | Admitting: Internal Medicine

## 2016-04-30 NOTE — Telephone Encounter (Signed)
Ms Brendan Abbott, guardian brought in form to be completed for admissions to [facility]. She will pick up the form when completed.

## 2016-05-06 NOTE — Telephone Encounter (Signed)
Ms Brendan Abbott is calling to find out when forms will be ready for pick up. Please call her on [phone] when forms are ready. Brendan SpillersSharon T Teryl Abbott, CMA

## 2016-05-06 NOTE — Telephone Encounter (Signed)
Forms placed in PCP box. 

## 2016-05-07 ENCOUNTER — Telehealth: Payer: Self-pay | Admitting: Internal Medicine

## 2016-05-07 ENCOUNTER — Other Ambulatory Visit: Payer: Self-pay | Admitting: Internal Medicine

## 2016-05-07 NOTE — Telephone Encounter (Signed)
I have completed the ICF-MR evaluation form [facility]. Form was placed in box of Tamika Weyerhaeuser CompanyMartin RN. Patient's legal guardian (aunt) will be contacted.   Tarri AbernethyAbigail J Starlina Lapre, MD, MPH PGY-2 [facility] Pager [phone]

## 2016-05-07 NOTE — Telephone Encounter (Signed)
Caregiver informed that forms were ready to be picked up.

## 2016-05-25 ENCOUNTER — Telehealth: Payer: Self-pay | Admitting: Internal Medicine

## 2016-05-25 NOTE — Telephone Encounter (Signed)
Legal guardian needs verification of PPD and immunization. Currently being admitted to group home. Fax  [phone]  Attn: debrorah or rhonda.

## 2016-06-03 ENCOUNTER — Other Ambulatory Visit: Payer: Self-pay | Admitting: Internal Medicine

## 2016-06-03 NOTE — Telephone Encounter (Signed)
Left message on voicemail for Janelle Flooraomi to call back. When she calls back please verify fax number.

## 2016-06-16 DIAGNOSIS — H903 Sensorineural hearing loss, bilateral: Secondary | ICD-10-CM | POA: Diagnosis not present

## 2016-06-18 ENCOUNTER — Other Ambulatory Visit: Payer: Self-pay | Admitting: Internal Medicine

## 2016-06-21 DIAGNOSIS — R350 Frequency of micturition: Secondary | ICD-10-CM | POA: Diagnosis not present

## 2016-06-22 DIAGNOSIS — I10 Essential (primary) hypertension: Secondary | ICD-10-CM | POA: Diagnosis not present

## 2016-06-22 DIAGNOSIS — H2513 Age-related nuclear cataract, bilateral: Secondary | ICD-10-CM | POA: Diagnosis not present

## 2016-06-22 DIAGNOSIS — H524 Presbyopia: Secondary | ICD-10-CM | POA: Diagnosis not present

## 2016-07-06 DIAGNOSIS — I10 Essential (primary) hypertension: Secondary | ICD-10-CM | POA: Diagnosis not present

## 2016-07-07 DIAGNOSIS — H903 Sensorineural hearing loss, bilateral: Secondary | ICD-10-CM | POA: Diagnosis not present

## 2016-08-17 DIAGNOSIS — H409 Unspecified glaucoma: Secondary | ICD-10-CM | POA: Diagnosis not present

## 2016-08-17 DIAGNOSIS — I10 Essential (primary) hypertension: Secondary | ICD-10-CM | POA: Diagnosis not present

## 2016-09-14 DIAGNOSIS — I10 Essential (primary) hypertension: Secondary | ICD-10-CM | POA: Diagnosis not present

## 2016-10-18 DIAGNOSIS — I10 Essential (primary) hypertension: Secondary | ICD-10-CM | POA: Diagnosis not present

## 2016-10-19 DIAGNOSIS — I10 Essential (primary) hypertension: Secondary | ICD-10-CM | POA: Diagnosis not present

## 2016-12-21 DIAGNOSIS — I10 Essential (primary) hypertension: Secondary | ICD-10-CM | POA: Diagnosis not present

## 2017-01-18 DIAGNOSIS — I10 Essential (primary) hypertension: Secondary | ICD-10-CM | POA: Diagnosis not present

## 2017-02-10 DIAGNOSIS — I10 Essential (primary) hypertension: Secondary | ICD-10-CM | POA: Diagnosis not present

## 2017-02-10 DIAGNOSIS — R972 Elevated prostate specific antigen [PSA]: Secondary | ICD-10-CM | POA: Diagnosis not present

## 2017-02-10 DIAGNOSIS — E559 Vitamin D deficiency, unspecified: Secondary | ICD-10-CM | POA: Diagnosis not present

## 2017-03-01 DIAGNOSIS — I10 Essential (primary) hypertension: Secondary | ICD-10-CM | POA: Diagnosis not present

## 2017-03-01 DIAGNOSIS — E559 Vitamin D deficiency, unspecified: Secondary | ICD-10-CM | POA: Diagnosis not present

## 2017-03-03 ENCOUNTER — Encounter (HOSPITAL_COMMUNITY): Payer: Self-pay | Admitting: *Deleted

## 2017-03-03 ENCOUNTER — Emergency Department (HOSPITAL_COMMUNITY)
Admission: EM | Admit: 2017-03-03 | Discharge: 2017-03-03 | Disposition: A | Discharge status: Home / Self Care | Payer: Medicare Other | Attending: Emergency Medicine | Admitting: Emergency Medicine

## 2017-03-03 DIAGNOSIS — S0101XA Laceration without foreign body of scalp, initial encounter: Secondary | ICD-10-CM | POA: Diagnosis not present

## 2017-03-03 DIAGNOSIS — Y998 Other external cause status: Secondary | ICD-10-CM | POA: Insufficient documentation

## 2017-03-03 DIAGNOSIS — Z7982 Long term (current) use of aspirin: Secondary | ICD-10-CM | POA: Insufficient documentation

## 2017-03-03 DIAGNOSIS — Z79899 Other long term (current) drug therapy: Secondary | ICD-10-CM | POA: Diagnosis not present

## 2017-03-03 DIAGNOSIS — Y939 Activity, unspecified: Secondary | ICD-10-CM | POA: Diagnosis not present

## 2017-03-03 DIAGNOSIS — W19XXXA Unspecified fall, initial encounter: Secondary | ICD-10-CM | POA: Diagnosis not present

## 2017-03-03 DIAGNOSIS — S0990XA Unspecified injury of head, initial encounter: Secondary | ICD-10-CM | POA: Diagnosis not present

## 2017-03-03 DIAGNOSIS — S0181XA Laceration without foreign body of other part of head, initial encounter: Secondary | ICD-10-CM | POA: Diagnosis not present

## 2017-03-03 DIAGNOSIS — Y929 Unspecified place or not applicable: Secondary | ICD-10-CM | POA: Insufficient documentation

## 2017-03-03 MED ORDER — LIDOCAINE-EPINEPHRINE-TETRACAINE (LET) SOLUTION
3.0000 mL | Freq: Once | NASAL | Status: AC
  Administered 2017-03-03: 3 mL via TOPICAL
  Filled 2017-03-03: qty 3

## 2017-03-03 MED ORDER — LIDOCAINE HCL (PF) 1 % IJ SOLN
5.0000 mL | Freq: Once | INTRAMUSCULAR | Status: DC
  Filled 2017-03-03: qty 5

## 2017-03-03 NOTE — Discharge Instructions (Signed)
Staples need removed in 7-10 days. Ok to shower/bath and clean the wound gently with mild soap/water.

## 2017-03-03 NOTE — ED Triage Notes (Signed)
Pt tripped and fell over his feet dueing PT.  He struck the side of his head, No LOC.  Pt has 1 inch lac on right side of head.  Pt is a&O at baseline, hx of MR per ems.

## 2017-03-03 NOTE — ED Notes (Addendum)
Checked pt Group home chart and last tetanus was May 2018

## 2017-03-03 NOTE — ED Notes (Signed)
D/c paperwork discussed with and given to [name] who has been at pt bedside and works at the group home hwere Mr. Brendan Abbott lives.  Pt had a accident, gave paper scrubs to change into.  Report also given to case worker at the group home.  Pt is in no distress-

## 2017-03-03 NOTE — ED Provider Notes (Signed)
[facility]-EMERGENCY DEPT Provider Note   CSN: [id number] Arrival date & time: 03/03/17  1509     History   Chief Complaint Chief Complaint  Patient presents with  . Fall    HPI Brendan Abbott is a 51 y.o. male.  HPI   51 year old male presenting for evaluation of a scalp laceration.  Patient has severe MR and does not give any useful history.  There is a caretaker at bedside but they did not actually witness the fall.  Per report, was mechanical in nature.  No report of loss of consciousness.  Caretaker states that he seems to be at his baseline.  No reported vomiting.  Tetanus shot in May of this year.  Past Medical History:  Diagnosis Date  . Hypertension   . MR (mental retardation)     Patient Active Problem List   Diagnosis Date Noted  . Sore throat 05/23/2015  . Left elbow pain 12/27/2013  . Left knee pain 08/22/2013  . Seasonal allergies 05/16/2013  . Xerosis of skin 04/25/2013  . 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 UNSPECIFIED URINARY INCONTINENCE 07/11/2008  . 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 OBESITY 05/19/2007  . Mental Retardation 07/07/2006  . HYPERTENSION, BENIGN SYSTEMIC 07/07/2006    History reviewed. No pertinent surgical history.     Home Medications    Prior to Admission medications   Medication Sig Start Date End Date Taking? Authorizing Provider  aspirin 81 MG chewable tablet Chew 1 tablet (81 mg total) by mouth daily. 12/23/15   Marquette SaaLancaster, Abigail Joseph, MD  diclofenac (CATAFLAM) 50 MG tablet Take 1 tablet (50 mg total) by mouth 3 (three) times daily. 03/06/16   Tharon AquasPatrick, Frank C, PA  hydrochlorothiazide (HYDRODIURIL) 25 MG tablet TAKE 1 TABLET ONCE DAILY. 06/18/16   Marquette SaaLancaster, Abigail Joseph, MD  lisinopril (PRINIVIL,ZESTRIL) 10 MG tablet TAKE 1 TABLET ONCE DAILY. 06/18/16   Marquette SaaLancaster, Abigail Joseph, MD  meloxicam (MOBIC) 15 MG tablet Take 1 tablet (15 mg total) by mouth daily. 12/27/13   Charlane FerrettiMarsh, Melanie C, MD    metoprolol (LOPRESSOR) 100 MG tablet TAKE 1&1/2 TABLETS TWICE DAILY. 06/18/16   Marquette SaaLancaster, Abigail Joseph, MD    Family History No family history on file.  Social History Social History  Substance Use Topics  . Smoking status: Never Smoker  . Smokeless tobacco: Not on file  . Alcohol use No     Allergies   Patient has no known allergies.   Review of Systems Review of Systems  Level 5 caveat because of severe MR.  Physical Exam Updated Vital Signs There were no vitals taken for this visit.  Physical Exam  Constitutional: He appears well-developed and well-nourished. No distress.  HENT:  Head: Normocephalic.  4 cm laceration to the right temporoparietal region.  Does not seem to have any significant bony tenderness.  No apparent midline spinal tenderness.  Range of motion of large joints without apparent discomfort.  Eyes: Conjunctivae are normal. Right eye exhibits no discharge. Left eye exhibits no discharge.  Neck: Neck supple.  Cardiovascular: Normal rate, regular rhythm and normal heart sounds.  Exam reveals no gallop and no friction rub.   No murmur heard. Pulmonary/Chest: Effort normal and breath sounds normal. No respiratory distress.  Abdominal: Soft. He exhibits no distension. There is no tenderness.  Genitourinary:  Genitourinary Comments: Incontinent of urine.   Musculoskeletal: He exhibits no edema or tenderness.  Neurological: He is alert.  Skin: Skin is warm and dry.  Psychiatric: He has  a normal mood and affect. His behavior is normal. Thought content normal.  Nursing note and vitals reviewed.    ED Treatments / Results  Labs (all labs ordered are listed, but only abnormal results are displayed) Labs Reviewed - No data to display  EKG  EKG Interpretation None       Radiology No results found.  Procedures Procedures (including critical care time)  LACERATION REPAIR Performed by: Raeford Razor Authorized by: Raeford Razor Consent:  Verbal consent obtained. Risks and benefits: risks, benefits and alternatives were discussed Consent given by: patient Patient identity confirmed: provided demographic data Prepped and Draped in normal sterile fashion Wound explored  Laceration Location: r scalp  Laceration Length: 4 cm  No Foreign Bodies seen or palpated  Anesthesia: local infiltration  Local anesthetic: LET  Anesthetic total: 3 ml  Skin closure: stapled  Number of sutures: 4   Patient tolerance: Patient tolerated the procedure well with no immediate complications.   Medications Ordered in ED Medications  lidocaine (PF) (XYLOCAINE) 1 % injection 5 mL (0 mLs Other Hold 03/03/17 1722)  lidocaine-EPINEPHrine-tetracaine (LET) solution (3 mLs Topical Given 03/03/17 1721)     Initial Impression / Assessment and Plan / ED Course  I have reviewed the triage vital signs and the nursing notes.  Pertinent labs & imaging results that were available during my care of the patient were reviewed by me and considered in my medical decision making (see chart for details).     51 year old male with scalp laceration.  Hard to get good review of systems, but does not appear to be distressed. Caretaker reports that he seems to be at his baseline.  Scalp laceration was repaired. Initially planned absorbable sutures so he wouldn't have to deal with removal, but pt was extremely fearful even with staff and myself trying to reassess. Could not even attempt to inject lidocaine safely. LET was applied. Stapled for reason of speed which he actually tolerated very well but will have to be removed in in 7-10 days.  Continued wound care and return precautions discussed with caretaker.   Final Clinical Impressions(s) / ED Diagnoses   Final diagnoses:  Laceration of scalp, initial encounter    New Prescriptions New Prescriptions   No medications on file     Raeford Razor, MD 03/03/17 1750

## 2017-03-08 DIAGNOSIS — S0990XD Unspecified injury of head, subsequent encounter: Secondary | ICD-10-CM | POA: Diagnosis not present

## 2017-03-08 DIAGNOSIS — S0101XA Laceration without foreign body of scalp, initial encounter: Secondary | ICD-10-CM | POA: Diagnosis not present

## 2017-03-08 DIAGNOSIS — I10 Essential (primary) hypertension: Secondary | ICD-10-CM | POA: Diagnosis not present

## 2017-05-02 DIAGNOSIS — I10 Essential (primary) hypertension: Secondary | ICD-10-CM | POA: Diagnosis not present

## 2017-05-17 DIAGNOSIS — I10 Essential (primary) hypertension: Secondary | ICD-10-CM | POA: Diagnosis not present

## 2017-05-24 DIAGNOSIS — I10 Essential (primary) hypertension: Secondary | ICD-10-CM | POA: Diagnosis not present

## 2017-05-24 DIAGNOSIS — Z0000 Encounter for general adult medical examination without abnormal findings: Secondary | ICD-10-CM | POA: Diagnosis not present

## 2017-05-24 DIAGNOSIS — E559 Vitamin D deficiency, unspecified: Secondary | ICD-10-CM | POA: Diagnosis not present

## 2017-06-05 ENCOUNTER — Emergency Department (HOSPITAL_COMMUNITY)
Admission: EM | Admit: 2017-06-05 | Discharge: 2017-06-06 | Disposition: A | Discharge status: Home / Self Care | Payer: Medicare Other | Attending: Emergency Medicine | Admitting: Emergency Medicine

## 2017-06-05 ENCOUNTER — Other Ambulatory Visit: Payer: Self-pay

## 2017-06-05 ENCOUNTER — Encounter (HOSPITAL_COMMUNITY): Payer: Self-pay | Admitting: Emergency Medicine

## 2017-06-05 DIAGNOSIS — F79 Unspecified intellectual disabilities: Secondary | ICD-10-CM | POA: Diagnosis not present

## 2017-06-05 DIAGNOSIS — M10022 Idiopathic gout, left elbow: Secondary | ICD-10-CM | POA: Diagnosis not present

## 2017-06-05 DIAGNOSIS — M25522 Pain in left elbow: Secondary | ICD-10-CM | POA: Diagnosis present

## 2017-06-05 DIAGNOSIS — Z79899 Other long term (current) drug therapy: Secondary | ICD-10-CM | POA: Insufficient documentation

## 2017-06-05 DIAGNOSIS — I10 Essential (primary) hypertension: Secondary | ICD-10-CM | POA: Insufficient documentation

## 2017-06-05 DIAGNOSIS — M7989 Other specified soft tissue disorders: Secondary | ICD-10-CM | POA: Diagnosis not present

## 2017-06-05 LAB — CBC WITH DIFFERENTIAL/PLATELET
Basophils Absolute: 0 10*3/uL (ref 0.0–0.1)
Basophils Relative: 0 %
Eosinophils Absolute: 0.1 10*3/uL (ref 0.0–0.7)
Eosinophils Relative: 1 %
HEMATOCRIT: 48.1 % (ref 39.0–52.0)
Hemoglobin: 15.7 g/dL (ref 13.0–17.0)
LYMPHS ABS: 2.2 10*3/uL (ref 0.7–4.0)
Lymphocytes Relative: 28 %
MCH: 25.5 pg — AB (ref 26.0–34.0)
MCHC: 32.6 g/dL (ref 30.0–36.0)
MCV: 78.2 fL (ref 78.0–100.0)
MONO ABS: 1.1 10*3/uL — AB (ref 0.1–1.0)
MONOS PCT: 14 %
NEUTROS ABS: 4.6 10*3/uL (ref 1.7–7.7)
Neutrophils Relative %: 57 %
Platelets: 191 10*3/uL (ref 150–400)
RBC: 6.15 MIL/uL — ABNORMAL HIGH (ref 4.22–5.81)
RDW: 15.7 % — AB (ref 11.5–15.5)
WBC: 8 10*3/uL (ref 4.0–10.5)

## 2017-06-05 LAB — BASIC METABOLIC PANEL
ANION GAP: 8 (ref 5–15)
BUN: 10 mg/dL (ref 6–20)
CALCIUM: 9.3 mg/dL (ref 8.9–10.3)
CO2: 30 mmol/L (ref 22–32)
Chloride: 102 mmol/L (ref 101–111)
Creatinine, Ser: 1.07 mg/dL (ref 0.61–1.24)
GFR calc Af Amer: >60 mL/min (ref >60)
GFR calc non Af Amer: >60 mL/min (ref >60)
GLUCOSE: 162 mg/dL — AB (ref 65–99)
Potassium: 4.4 mmol/L (ref 3.5–5.1)
Sodium: 140 mmol/L (ref 135–145)

## 2017-06-05 MED ORDER — DEXTROSE 5 % IV SOLN
100.0000 mg | Freq: Once | INTRAVENOUS | Status: AC
  Administered 2017-06-05: 100 mg via INTRAVENOUS
  Filled 2017-06-05: qty 100

## 2017-06-05 NOTE — ED Provider Notes (Signed)
  Face-to-face evaluation   History: Patient with severe mental retardation, lives in a group home for behavioral patient's.  Today about 2 PM it was noticed that his left elbow was painful and swollen.  No known trauma.  He has been treated with Tylenol without improvement.  No similar problem in the past.  Physical exam: Patient is alert, he is agitated, he is aggressive.  Left elbow is held at 90 degrees flexion, and has redness posteriorly, over the olecranon.  The swelling and redness is fairly diffuse, extending above and below the elbow.  There is no swelling of the distal forearm, hand or finger, left.  He is able to move the left elbow somewhat but seems to resist and guard against movement.  Medical screening examination/treatment/procedure(s) were conducted as a shared visit with non-physician practitioner(s) and myself.  I personally evaluated the patient during the encounter    Mancel BaleWentz, Elisabeth Strom, MD 06/06/17 1510

## 2017-06-05 NOTE — ED Triage Notes (Signed)
Per caregiver pt has swelling to left forearm that stated at appx 2pm. Area warm to touch and was was given 650mg  Tylenol at 5pm. Pt resides at a group home. No know injury to arm.

## 2017-06-05 NOTE — ED Provider Notes (Signed)
[facility] Provider Note   CSN: [id number] Arrival date & time: 06/05/17  2034     History   Chief Complaint Chief Complaint  Patient presents with  . Arm Swelling    HPI Brendan Abbott is a 52 y.o. male.  Patient with a history of HTN and mental retardation, here with caregiver who reports left arm swelling and discomfort at the elbow that was first noticed earlier today around 2:00 pm. The swelling has gotten progressively worse throughout the day with onset of redness. No fever. He was given Tylenol 650 mg for pain which caregiver feels helped. No known injury or wound.    The history is provided by the patient. No language interpreter was used.    Past Medical History:  Diagnosis Date  . Hypertension   . MR (mental retardation)     Patient Active Problem List   Diagnosis Date Noted  . Sore throat 05/23/2015  . Left elbow pain 12/27/2013  . Left knee pain 08/22/2013  . Seasonal allergies 05/16/2013  . Xerosis of skin 04/25/2013  . 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 UNSPECIFIED URINARY INCONTINENCE 07/11/2008  . 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 OBESITY 05/19/2007  . Mental Retardation 07/07/2006  . HYPERTENSION, BENIGN SYSTEMIC 07/07/2006    History reviewed. No pertinent surgical history.     Home Medications    Prior to Admission medications   Medication Sig Start Date End Date Taking? Authorizing Provider  aspirin 81 MG chewable tablet Chew 1 tablet (81 mg total) by mouth daily. 12/23/15   Marquette SaaLancaster, Abigail Joseph, MD  diclofenac (CATAFLAM) 50 MG tablet Take 1 tablet (50 mg total) by mouth 3 (three) times daily. 03/06/16   Tharon AquasPatrick, Frank C, PA  hydrochlorothiazide (HYDRODIURIL) 25 MG tablet TAKE 1 TABLET ONCE DAILY. 06/18/16   Marquette SaaLancaster, Abigail Joseph, MD  lisinopril (PRINIVIL,ZESTRIL) 10 MG tablet TAKE 1 TABLET ONCE DAILY. 06/18/16   Marquette SaaLancaster, Abigail Joseph, MD  meloxicam (MOBIC) 15 MG tablet Take 1 tablet (15 mg  total) by mouth daily. 12/27/13   Charlane FerrettiMarsh, Melanie C, MD  metoprolol (LOPRESSOR) 100 MG tablet TAKE 1&1/2 TABLETS TWICE DAILY. 06/18/16   Marquette SaaLancaster, Abigail Joseph, MD    Family History History reviewed. No pertinent family history.  Social History Social History   Tobacco Use  . Smoking status: Never Smoker  Substance Use Topics  . Alcohol use: No  . Drug use: No     Allergies   Patient has no known allergies.   Review of Systems Review of Systems  Constitutional: Negative for chills and fever.  Gastrointestinal: Negative.  Negative for vomiting.  Musculoskeletal:       See HPI.  Skin: Positive for color change. Negative for wound.  Neurological:       Information limited due to history of MR.  Psychiatric/Behavioral: Negative for agitation.     Physical Exam Updated Vital Signs BP (!) 126/92 (BP Location: Left Arm)   Pulse 94   Temp 98.9 F (37.2 C) (Oral)   Resp 20   Ht 5\' 10"  (1.778 m)   Wt 95.3 kg (210 lb)   SpO2 96%   BMI 30.13 kg/m   Physical Exam  Constitutional: He is oriented to person, place, and time. He appears well-developed and well-nourished.  Neck: Normal range of motion.  Pulmonary/Chest: Effort normal.  Musculoskeletal: Normal range of motion.  Left arm swollen over elbow and mid-arm. No wound, blistering, pustule or palpable abscess. Patient resists extension of arm.  Neurological: He is alert and oriented to person, place, and time.  Skin: Skin is warm and dry.  Psychiatric: He has a normal mood and affect.     ED Treatments / Results  Labs (all labs ordered are listed, but only abnormal results are displayed) Labs Reviewed  CBC WITH DIFFERENTIAL/PLATELET  BASIC METABOLIC PANEL    EKG  EKG Interpretation None       Radiology No results found.  Procedures Procedures (including critical care time)  Medications Ordered in ED Medications  doxycycline (VIBRAMYCIN) 100 mg in dextrose 5 % 250 mL IVPB (not administered)      Initial Impression / Assessment and Plan / ED Course  I have reviewed the triage vital signs and the nursing notes.  Pertinent labs & imaging results that were available during my care of the patient were reviewed by me and considered in my medical decision making (see chart for details).     Patient presents for evaluation of swelling, pain and redness to left elbow that started today. The patient has severe MR and cannot contribute to history. No reported fever.   No leukocytosis, no fever. He is evaluated by Dr. Effie Shy who feels symptoms represent gout or bursitis. Imaging show soft tissue findings (inflammation vs infection). Uric acid mildly elevated. Favor Dx Gouty arthritis. Will treat with Indocin and recommend recheck with PCP in 2-3 days.  Final Clinical Impressions(s) / ED Diagnoses   Final diagnoses:  None   1. Gout, left elbow  ED Discharge Orders    None       Elpidio Anis, Cordelia Poche 06/06/17 0115    Mancel Bale, MD 06/06/17 [phone]

## 2017-06-06 ENCOUNTER — Emergency Department (HOSPITAL_COMMUNITY): Payer: Medicare Other

## 2017-06-06 DIAGNOSIS — M10022 Idiopathic gout, left elbow: Secondary | ICD-10-CM | POA: Diagnosis not present

## 2017-06-06 DIAGNOSIS — M7989 Other specified soft tissue disorders: Secondary | ICD-10-CM | POA: Diagnosis not present

## 2017-06-06 DIAGNOSIS — M25522 Pain in left elbow: Secondary | ICD-10-CM | POA: Diagnosis not present

## 2017-06-06 LAB — URIC ACID: Uric Acid, Serum: 8.5 mg/dL — ABNORMAL HIGH (ref 4.4–7.6)

## 2017-06-06 MED ORDER — FENTANYL CITRATE (PF) 100 MCG/2ML IJ SOLN
50.0000 ug | Freq: Once | INTRAMUSCULAR | Status: AC
  Administered 2017-06-06: 100 ug via INTRAVENOUS
  Filled 2017-06-06: qty 2

## 2017-06-06 MED ORDER — INDOMETHACIN 25 MG PO CAPS: 25.0000 mg | ORAL_CAPSULE | Freq: Three times a day (TID) | ORAL | 0 refills | Status: AC | PRN

## 2017-06-07 DIAGNOSIS — M109 Gout, unspecified: Secondary | ICD-10-CM | POA: Diagnosis not present

## 2017-06-07 DIAGNOSIS — I10 Essential (primary) hypertension: Secondary | ICD-10-CM | POA: Diagnosis not present

## 2017-06-21 DIAGNOSIS — E559 Vitamin D deficiency, unspecified: Secondary | ICD-10-CM | POA: Diagnosis not present

## 2017-06-21 DIAGNOSIS — N10 Acute tubulo-interstitial nephritis: Secondary | ICD-10-CM | POA: Diagnosis not present

## 2017-06-21 DIAGNOSIS — M109 Gout, unspecified: Secondary | ICD-10-CM | POA: Diagnosis not present

## 2017-06-21 DIAGNOSIS — I10 Essential (primary) hypertension: Secondary | ICD-10-CM | POA: Diagnosis not present

## 2017-06-21 DIAGNOSIS — R600 Localized edema: Secondary | ICD-10-CM | POA: Diagnosis not present

## 2017-06-21 DIAGNOSIS — M25522 Pain in left elbow: Secondary | ICD-10-CM | POA: Diagnosis not present

## 2017-06-22 DIAGNOSIS — I10 Essential (primary) hypertension: Secondary | ICD-10-CM | POA: Diagnosis not present

## 2017-06-22 DIAGNOSIS — M109 Gout, unspecified: Secondary | ICD-10-CM | POA: Diagnosis not present

## 2017-06-28 DIAGNOSIS — I10 Essential (primary) hypertension: Secondary | ICD-10-CM | POA: Diagnosis not present

## 2017-06-28 DIAGNOSIS — M109 Gout, unspecified: Secondary | ICD-10-CM | POA: Diagnosis not present

## 2017-07-12 DIAGNOSIS — M109 Gout, unspecified: Secondary | ICD-10-CM | POA: Diagnosis not present

## 2017-07-12 DIAGNOSIS — I10 Essential (primary) hypertension: Secondary | ICD-10-CM | POA: Diagnosis not present

## 2017-07-19 DIAGNOSIS — I10 Essential (primary) hypertension: Secondary | ICD-10-CM | POA: Diagnosis not present

## 2017-07-19 DIAGNOSIS — J00 Acute nasopharyngitis [common cold]: Secondary | ICD-10-CM | POA: Diagnosis not present

## 2017-07-26 DIAGNOSIS — I10 Essential (primary) hypertension: Secondary | ICD-10-CM | POA: Diagnosis not present

## 2017-07-26 DIAGNOSIS — M109 Gout, unspecified: Secondary | ICD-10-CM | POA: Diagnosis not present

## 2017-08-08 DIAGNOSIS — Z79899 Other long term (current) drug therapy: Secondary | ICD-10-CM | POA: Diagnosis not present

## 2017-08-08 DIAGNOSIS — I10 Essential (primary) hypertension: Secondary | ICD-10-CM | POA: Diagnosis not present

## 2017-08-08 DIAGNOSIS — E559 Vitamin D deficiency, unspecified: Secondary | ICD-10-CM | POA: Diagnosis not present

## 2017-08-08 DIAGNOSIS — E785 Hyperlipidemia, unspecified: Secondary | ICD-10-CM | POA: Diagnosis not present

## 2017-08-09 DIAGNOSIS — I10 Essential (primary) hypertension: Secondary | ICD-10-CM | POA: Diagnosis not present

## 2017-08-09 DIAGNOSIS — M109 Gout, unspecified: Secondary | ICD-10-CM | POA: Diagnosis not present

## 2017-08-09 DIAGNOSIS — E559 Vitamin D deficiency, unspecified: Secondary | ICD-10-CM | POA: Diagnosis not present

## 2017-09-13 DIAGNOSIS — E559 Vitamin D deficiency, unspecified: Secondary | ICD-10-CM | POA: Diagnosis not present

## 2017-09-13 DIAGNOSIS — I10 Essential (primary) hypertension: Secondary | ICD-10-CM | POA: Diagnosis not present

## 2017-09-20 DIAGNOSIS — I10 Essential (primary) hypertension: Secondary | ICD-10-CM | POA: Diagnosis not present

## 2017-09-20 DIAGNOSIS — M109 Gout, unspecified: Secondary | ICD-10-CM | POA: Diagnosis not present

## 2017-09-28 DIAGNOSIS — E559 Vitamin D deficiency, unspecified: Secondary | ICD-10-CM | POA: Diagnosis not present

## 2017-10-06 DIAGNOSIS — E559 Vitamin D deficiency, unspecified: Secondary | ICD-10-CM | POA: Diagnosis not present

## 2017-10-06 DIAGNOSIS — Z1211 Encounter for screening for malignant neoplasm of colon: Secondary | ICD-10-CM | POA: Diagnosis not present

## 2017-10-11 DIAGNOSIS — E559 Vitamin D deficiency, unspecified: Secondary | ICD-10-CM | POA: Diagnosis not present

## 2017-10-11 DIAGNOSIS — I10 Essential (primary) hypertension: Secondary | ICD-10-CM | POA: Diagnosis not present

## 2017-10-18 DIAGNOSIS — E559 Vitamin D deficiency, unspecified: Secondary | ICD-10-CM | POA: Diagnosis not present

## 2017-10-18 DIAGNOSIS — I10 Essential (primary) hypertension: Secondary | ICD-10-CM | POA: Diagnosis not present

## 2017-10-18 DIAGNOSIS — M109 Gout, unspecified: Secondary | ICD-10-CM | POA: Diagnosis not present

## 2017-11-08 DIAGNOSIS — K5730 Diverticulosis of large intestine without perforation or abscess without bleeding: Secondary | ICD-10-CM | POA: Diagnosis not present

## 2017-11-08 DIAGNOSIS — K648 Other hemorrhoids: Secondary | ICD-10-CM | POA: Diagnosis not present

## 2017-11-08 DIAGNOSIS — I10 Essential (primary) hypertension: Secondary | ICD-10-CM | POA: Diagnosis not present

## 2017-11-08 DIAGNOSIS — Z1211 Encounter for screening for malignant neoplasm of colon: Secondary | ICD-10-CM | POA: Diagnosis not present

## 2017-11-15 DIAGNOSIS — H409 Unspecified glaucoma: Secondary | ICD-10-CM | POA: Diagnosis not present

## 2017-11-15 DIAGNOSIS — I10 Essential (primary) hypertension: Secondary | ICD-10-CM | POA: Diagnosis not present

## 2017-11-15 DIAGNOSIS — H2513 Age-related nuclear cataract, bilateral: Secondary | ICD-10-CM | POA: Diagnosis not present

## 2017-11-22 DIAGNOSIS — M109 Gout, unspecified: Secondary | ICD-10-CM | POA: Diagnosis not present

## 2017-11-22 DIAGNOSIS — I10 Essential (primary) hypertension: Secondary | ICD-10-CM | POA: Diagnosis not present

## 2017-12-14 DIAGNOSIS — E039 Hypothyroidism, unspecified: Secondary | ICD-10-CM | POA: Diagnosis not present

## 2017-12-20 DIAGNOSIS — E559 Vitamin D deficiency, unspecified: Secondary | ICD-10-CM | POA: Diagnosis not present

## 2017-12-20 DIAGNOSIS — M109 Gout, unspecified: Secondary | ICD-10-CM | POA: Diagnosis not present

## 2017-12-20 DIAGNOSIS — I10 Essential (primary) hypertension: Secondary | ICD-10-CM | POA: Diagnosis not present

## 2017-12-21 DIAGNOSIS — I10 Essential (primary) hypertension: Secondary | ICD-10-CM | POA: Diagnosis not present

## 2017-12-21 DIAGNOSIS — M109 Gout, unspecified: Secondary | ICD-10-CM | POA: Diagnosis not present

## 2018-01-10 DIAGNOSIS — I10 Essential (primary) hypertension: Secondary | ICD-10-CM | POA: Diagnosis not present

## 2018-02-07 DIAGNOSIS — R05 Cough: Secondary | ICD-10-CM | POA: Diagnosis not present

## 2018-02-07 DIAGNOSIS — I10 Essential (primary) hypertension: Secondary | ICD-10-CM | POA: Diagnosis not present

## 2018-03-02 DIAGNOSIS — E559 Vitamin D deficiency, unspecified: Secondary | ICD-10-CM | POA: Diagnosis not present

## 2018-03-02 DIAGNOSIS — I10 Essential (primary) hypertension: Secondary | ICD-10-CM | POA: Diagnosis not present

## 2018-03-16 DIAGNOSIS — I10 Essential (primary) hypertension: Secondary | ICD-10-CM | POA: Diagnosis not present

## 2018-03-16 DIAGNOSIS — M109 Gout, unspecified: Secondary | ICD-10-CM | POA: Diagnosis not present

## 2018-03-21 DIAGNOSIS — E559 Vitamin D deficiency, unspecified: Secondary | ICD-10-CM | POA: Diagnosis not present

## 2018-03-21 DIAGNOSIS — I10 Essential (primary) hypertension: Secondary | ICD-10-CM | POA: Diagnosis not present

## 2018-05-16 DIAGNOSIS — M109 Gout, unspecified: Secondary | ICD-10-CM | POA: Diagnosis not present

## 2018-05-16 DIAGNOSIS — E559 Vitamin D deficiency, unspecified: Secondary | ICD-10-CM | POA: Diagnosis not present

## 2018-05-16 DIAGNOSIS — H409 Unspecified glaucoma: Secondary | ICD-10-CM | POA: Diagnosis not present

## 2018-05-16 DIAGNOSIS — I10 Essential (primary) hypertension: Secondary | ICD-10-CM | POA: Diagnosis not present

## 2018-05-30 DIAGNOSIS — M109 Gout, unspecified: Secondary | ICD-10-CM | POA: Diagnosis not present

## 2018-05-30 DIAGNOSIS — I10 Essential (primary) hypertension: Secondary | ICD-10-CM | POA: Diagnosis not present

## 2018-06-20 DIAGNOSIS — E559 Vitamin D deficiency, unspecified: Secondary | ICD-10-CM | POA: Diagnosis not present

## 2018-06-20 DIAGNOSIS — I10 Essential (primary) hypertension: Secondary | ICD-10-CM | POA: Diagnosis not present

## 2018-07-11 DIAGNOSIS — I10 Essential (primary) hypertension: Secondary | ICD-10-CM | POA: Diagnosis not present

## 2018-07-11 DIAGNOSIS — G26 Extrapyramidal and movement disorders in diseases classified elsewhere: Secondary | ICD-10-CM | POA: Diagnosis not present

## 2018-10-04 IMAGING — DX DG KNEE COMPLETE 4+V*L*
4 series · 4 of 4 positions shown · non-contrast
Comparison: None.

CLINICAL DATA: Pain

EXAM:
LEFT KNEE - COMPLETE 4+ VIEW

[knee ap]
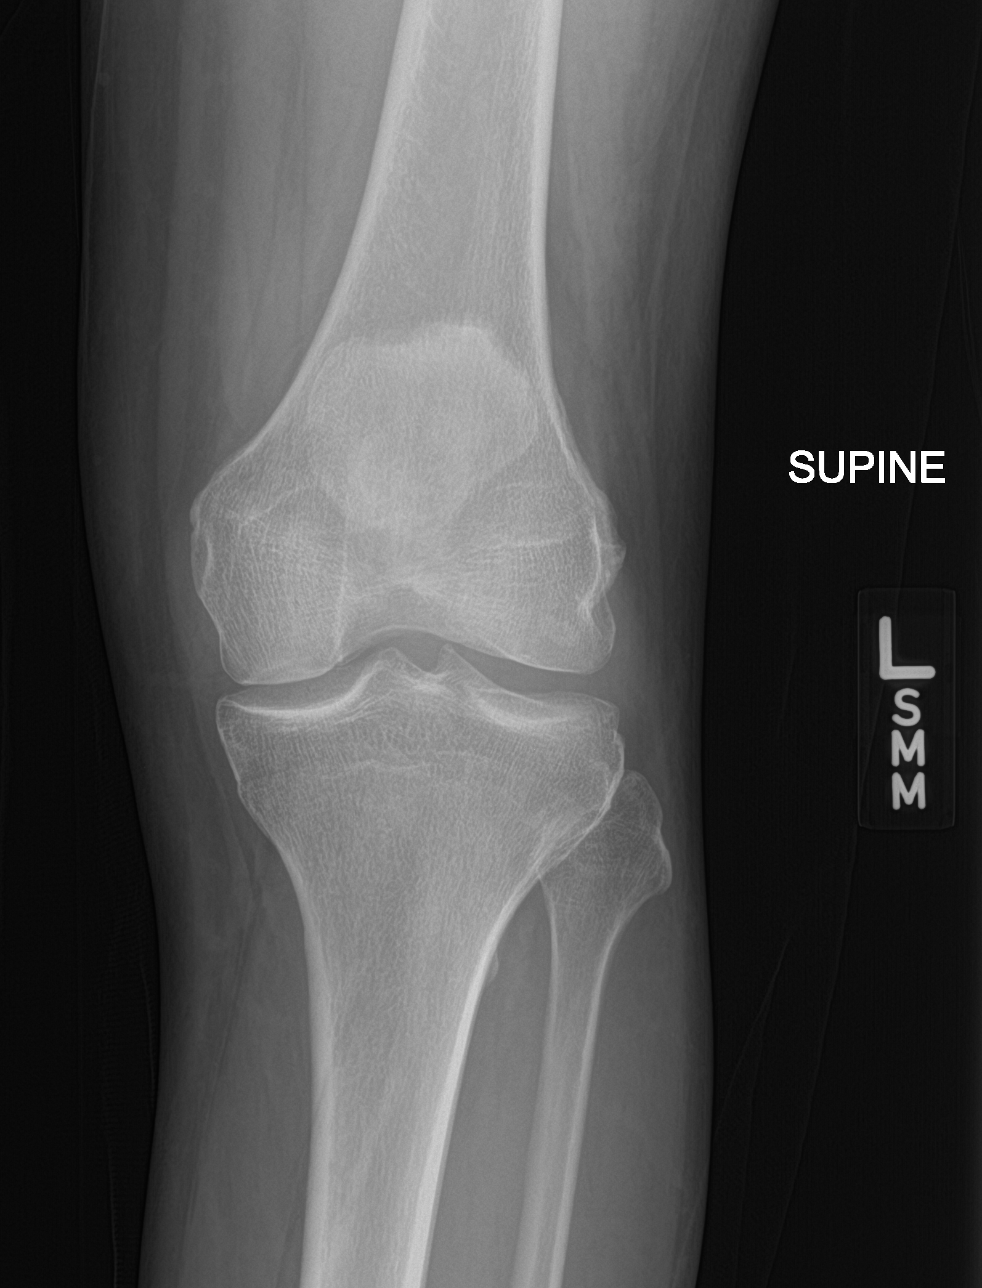

[knee obl (1 of 2)]
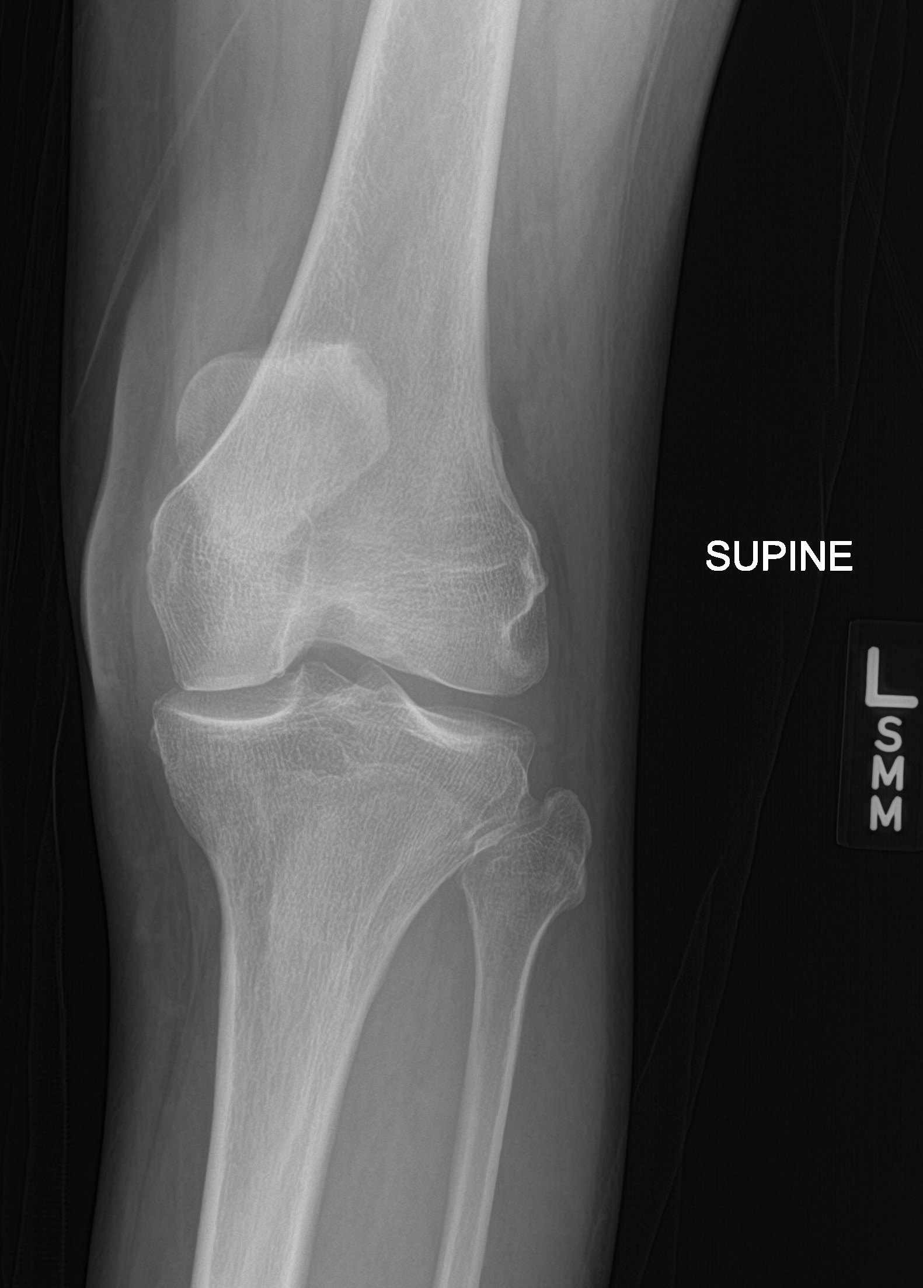

[knee obl (2 of 2)]
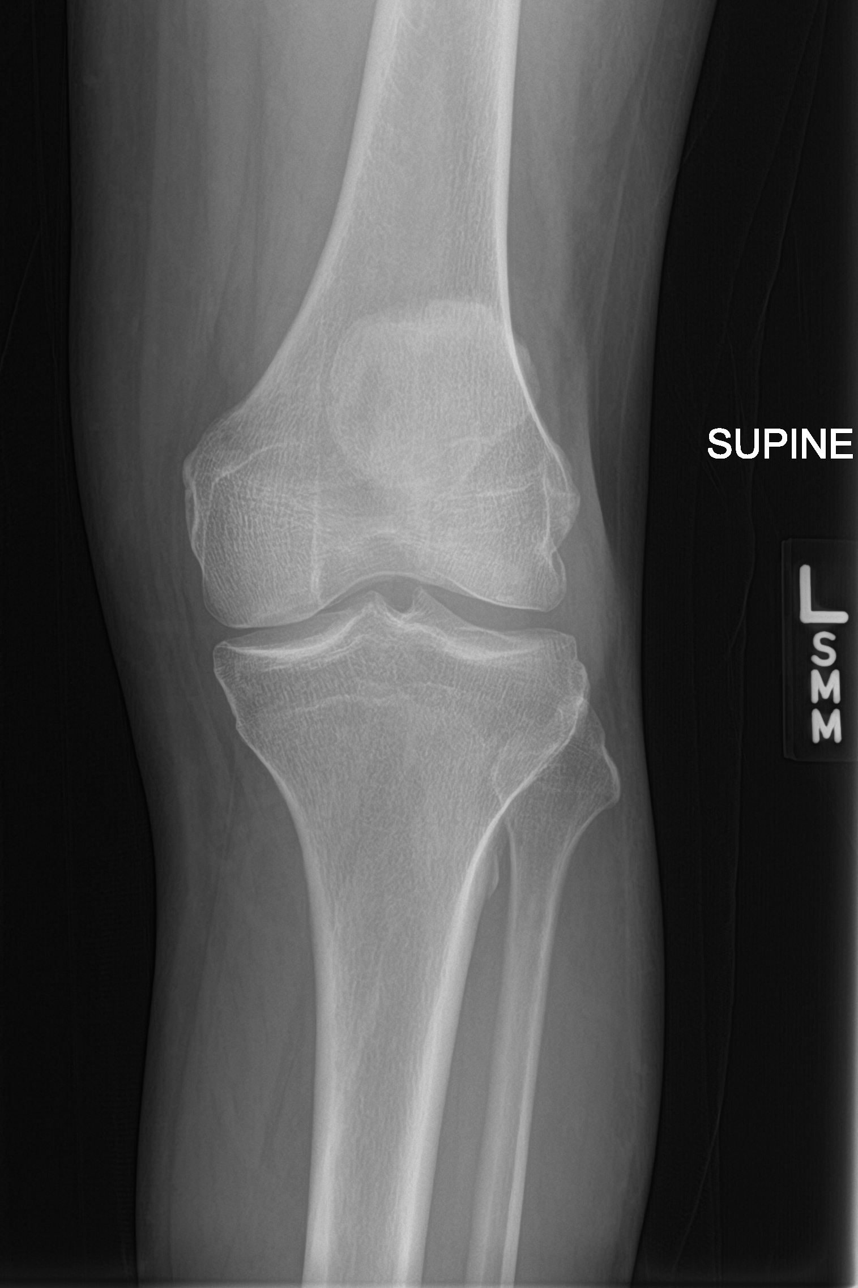

[knee lat]
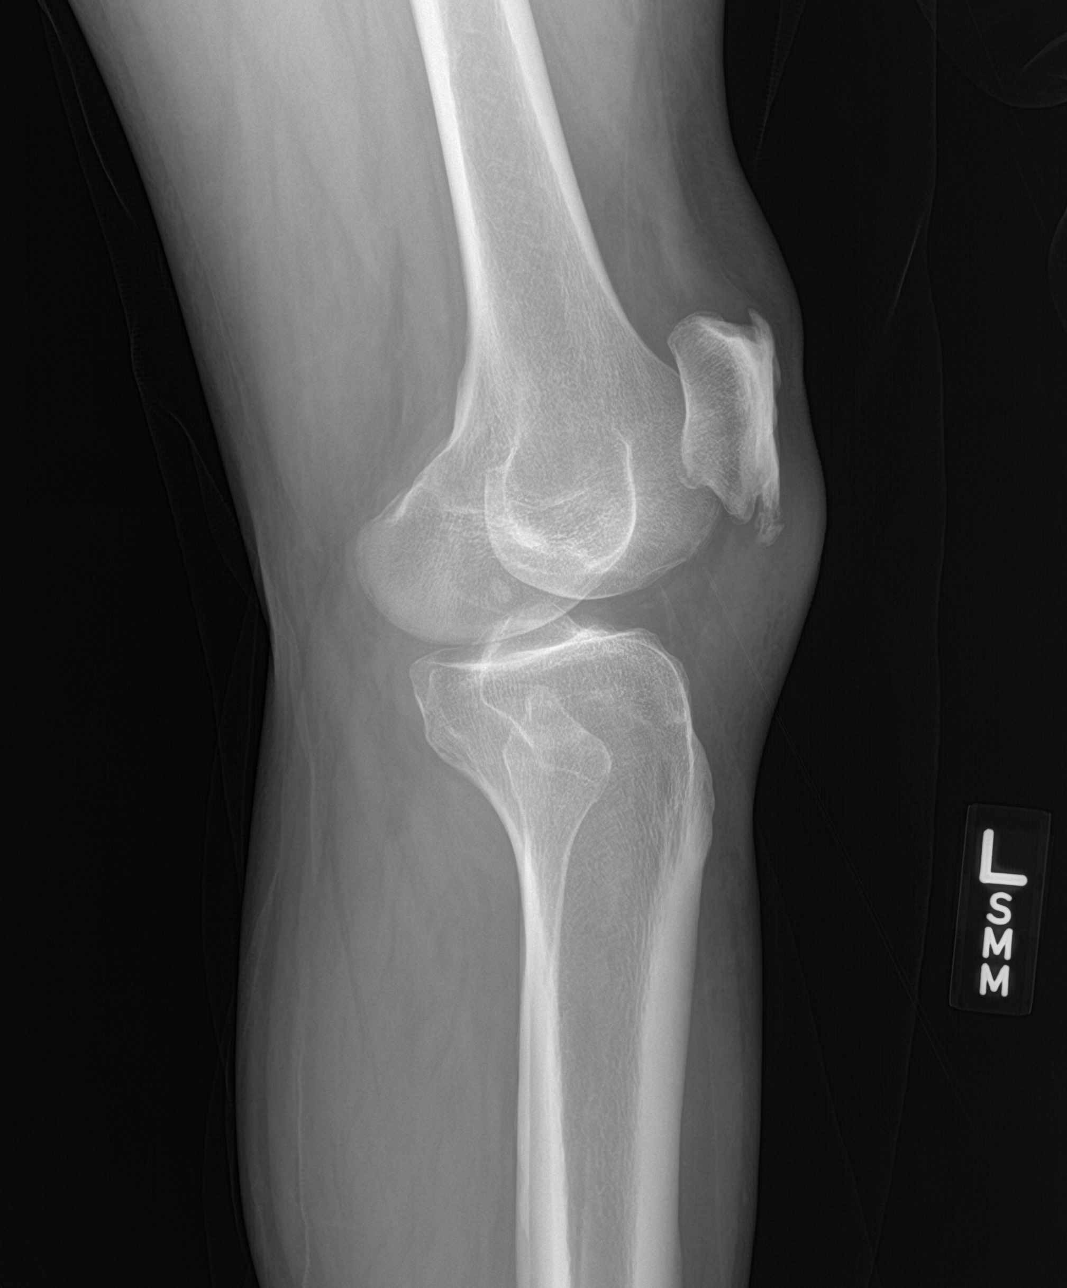

[4 of 4 positions shown; findings below may reference images not displayed]

FINDINGS: No acute fracture. No dislocation. Spurring at the superior and
inferior patella. Soft tissue swelling over the patellar tendon is
noted.
IMPRESSION: No acute bony pathology. Soft tissue swelling over the patellar
tendon is noted suggesting bursitis.

## 2021-07-28 ENCOUNTER — Ambulatory Visit
Admission: RE | Admit: 2021-07-28 | Discharge: 2021-07-28 | Disposition: A | Discharge status: Home / Self Care | Payer: Medicare Other | Source: Ambulatory Visit | Attending: Family Medicine | Admitting: Family Medicine

## 2021-07-28 ENCOUNTER — Other Ambulatory Visit: Payer: Self-pay | Admitting: Family Medicine

## 2021-07-28 ENCOUNTER — Other Ambulatory Visit: Payer: Self-pay

## 2021-07-28 DIAGNOSIS — I159 Secondary hypertension, unspecified: Secondary | ICD-10-CM

## 2021-10-28 ENCOUNTER — Ambulatory Visit (INDEPENDENT_AMBULATORY_CARE_PROVIDER_SITE_OTHER): Payer: Medicare Other | Admitting: Podiatry

## 2021-10-28 ENCOUNTER — Encounter: Payer: Self-pay | Admitting: Podiatry

## 2021-10-28 DIAGNOSIS — B351 Tinea unguium: Secondary | ICD-10-CM

## 2021-10-28 DIAGNOSIS — M79674 Pain in right toe(s): Secondary | ICD-10-CM | POA: Diagnosis not present

## 2021-10-28 DIAGNOSIS — M79675 Pain in left toe(s): Secondary | ICD-10-CM

## 2021-10-28 NOTE — Progress Notes (Signed)
  Subjective:  Patient ID: Brendan Abbott, male    DOB: 1966-01-11,  MRN: [id number]  Chief Complaint  Patient presents with   Nail Problem    Nail trim    56 y.o. male returns for the above complaint.  Patient presents with thickened elongated dystrophic toenails x10.  Mild pain on palpation.  Patient would like to for it to be debrided down.  He is not able to do it himself.  He denies any other acute complaints.  Objective:  There were no vitals filed for this visit. Podiatric Exam: Vascular: dorsalis pedis and posterior tibial pulses are palpable bilateral. Capillary return is immediate. Temperature gradient is WNL. Skin turgor WNL  Sensorium: Normal Semmes Weinstein monofilament test. Normal tactile sensation bilaterally. Nail Exam: Pt has thick disfigured discolored nails with subungual debris noted bilateral entire nail hallux through fifth toenails.  Pain on palpation to the nails. Ulcer Exam: There is no evidence of ulcer or pre-ulcerative changes or infection. Orthopedic Exam: Muscle tone and strength are WNL. No limitations in general ROM. No crepitus or effusions noted.  Skin: No Porokeratosis. No infection or ulcers    Assessment & Plan:   1. Pain due to onychomycosis of toenails of both feet     Patient was evaluated and treated and all questions answered.  Onychomycosis with pain  -Nails palliatively debrided as below. -Educated on self-care  Procedure: Nail Debridement Rationale: pain  Type of Debridement: manual, sharp debridement. Instrumentation: Nail nipper, rotary burr. Number of Nails: 10  Procedures and Treatment: Consent by patient was obtained for treatment procedures. The patient understood the discussion of treatment and procedures well. All questions were answered thoroughly reviewed. Debridement of mycotic and hypertrophic toenails, 1 through 5 bilateral and clearing of subungual debris. No ulceration, no infection noted.  Return Visit-Office  Procedure: Patient instructed to return to the office for a follow up visit 3 months for continued evaluation and treatment.  Nicholes Rough, DPM    Return in about 3 months (around 01/28/2022).

## 2021-11-03 ENCOUNTER — Ambulatory Visit
Admission: RE | Admit: 2021-11-03 | Discharge: 2021-11-03 | Disposition: A | Discharge status: Home / Self Care | Payer: Medicare Other | Source: Ambulatory Visit | Attending: Family Medicine | Admitting: Family Medicine

## 2021-11-03 ENCOUNTER — Other Ambulatory Visit: Payer: Self-pay | Admitting: Family Medicine

## 2021-11-03 DIAGNOSIS — T1490XA Injury, unspecified, initial encounter: Secondary | ICD-10-CM

## 2022-01-27 ENCOUNTER — Encounter: Payer: Self-pay | Admitting: Podiatry

## 2022-01-27 ENCOUNTER — Ambulatory Visit (INDEPENDENT_AMBULATORY_CARE_PROVIDER_SITE_OTHER): Payer: Medicare Other | Admitting: Podiatry

## 2022-01-27 DIAGNOSIS — M79675 Pain in left toe(s): Secondary | ICD-10-CM | POA: Diagnosis not present

## 2022-01-27 DIAGNOSIS — M79674 Pain in right toe(s): Secondary | ICD-10-CM

## 2022-01-27 DIAGNOSIS — B351 Tinea unguium: Secondary | ICD-10-CM

## 2022-01-27 NOTE — Progress Notes (Signed)
This patient presents to the office with chief complaint of long thick painful nails.  Patient says the nails are painful walking and wearing shoes.  This patient is unable to self treat.  This patient is unable to trim his nails since he is unable to reach his nails.  he presents to the office for preventative foot care services.He is accompanied by Andre his caregiver.  General Appearance  Alert, conversant and in no acute stress.  Vascular  Dorsalis pedis and posterior tibial  pulses are palpable  bilaterally.  Capillary return is within normal limits  bilaterally. Temperature is within normal limits  bilaterally.  Neurologic  Senn-Weinstein monofilament wire test within normal limits  bilaterally. Muscle power within normal limits bilaterally.  Nails Thick disfigured discolored nails with subungual debris  from hallux to fifth toes bilaterally. No evidence of bacterial infection or drainage bilaterally.  Orthopedic  No limitations of motion  feet .  No crepitus or effusions noted.  No bony pathology or digital deformities noted.  Skin  normotropic skin with no porokeratosis noted bilaterally.  No signs of infections or ulcers noted.     Onychomycosis  Nails  B/L.  Pain in right toes  Pain in left toes  Debridement of nails both feet followed trimming the nails with dremel tool.    RTC 3 months.   Aniko Finnigan DPM   

## 2022-04-08 ENCOUNTER — Ambulatory Visit
Admission: RE | Admit: 2022-04-08 | Discharge: 2022-04-08 | Disposition: A | Discharge status: Home / Self Care | Payer: Medicare Other | Source: Ambulatory Visit | Attending: Family Medicine | Admitting: Family Medicine

## 2022-04-08 ENCOUNTER — Other Ambulatory Visit: Payer: Self-pay | Admitting: Family Medicine

## 2022-04-08 DIAGNOSIS — M109 Gout, unspecified: Secondary | ICD-10-CM

## 2022-04-30 ENCOUNTER — Ambulatory Visit (INDEPENDENT_AMBULATORY_CARE_PROVIDER_SITE_OTHER): Payer: Medicare Other | Admitting: Podiatry

## 2022-04-30 ENCOUNTER — Encounter: Payer: Self-pay | Admitting: Podiatry

## 2022-04-30 DIAGNOSIS — B351 Tinea unguium: Secondary | ICD-10-CM | POA: Diagnosis not present

## 2022-04-30 NOTE — Progress Notes (Signed)
This patient presents to the office with chief complaint of long thick painful nails.  Patient says the nails are painful walking and wearing shoes.  This patient is unable to self treat.  This patient is unable to trim his nails since he is unable to reach his nails.  he presents to the office for preventative foot care services.He is accompanied by Andre his caregiver.  General Appearance  Alert, conversant and in no acute stress.  Vascular  Dorsalis pedis and posterior tibial  pulses are palpable  bilaterally.  Capillary return is within normal limits  bilaterally. Temperature is within normal limits  bilaterally.  Neurologic  Senn-Weinstein monofilament wire test within normal limits  bilaterally. Muscle power within normal limits bilaterally.  Nails Thick disfigured discolored nails with subungual debris  from hallux to fifth toes bilaterally. No evidence of bacterial infection or drainage bilaterally.  Orthopedic  No limitations of motion  feet .  No crepitus or effusions noted.  No bony pathology or digital deformities noted.  Skin  normotropic skin with no porokeratosis noted bilaterally.  No signs of infections or ulcers noted.     Onychomycosis  Nails  B/L.  Pain in right toes  Pain in left toes  Debridement of nails both feet followed trimming the nails with dremel tool.    RTC 3 months.   Fartun Paradiso DPM   

## 2022-07-30 ENCOUNTER — Ambulatory Visit (INDEPENDENT_AMBULATORY_CARE_PROVIDER_SITE_OTHER): Payer: Medicare Other | Admitting: Podiatry

## 2022-07-30 ENCOUNTER — Encounter: Payer: Self-pay | Admitting: Podiatry

## 2022-07-30 DIAGNOSIS — M79675 Pain in left toe(s): Secondary | ICD-10-CM

## 2022-07-30 DIAGNOSIS — M79674 Pain in right toe(s): Secondary | ICD-10-CM | POA: Diagnosis not present

## 2022-07-30 DIAGNOSIS — B351 Tinea unguium: Secondary | ICD-10-CM

## 2022-07-30 NOTE — Progress Notes (Signed)
This patient presents to the office with chief complaint of long thick painful nails.  Patient says the nails are painful walking and wearing shoes.  This patient is unable to self treat.  This patient is unable to trim his nails since he is unable to reach his nails.  he presents to the office for preventative foot care services.He is accompanied by Venora Maples his caregiver.  General Appearance  Alert, conversant and in no acute stress.  Vascular  Dorsalis pedis and posterior tibial  pulses are palpable  bilaterally.  Capillary return is within normal limits  bilaterally. Temperature is within normal limits  bilaterally.  Neurologic  Senn-Weinstein monofilament wire test within normal limits  bilaterally. Muscle power within normal limits bilaterally.  Nails Thick disfigured discolored nails with subungual debris  from hallux to fifth toes bilaterally. No evidence of bacterial infection or drainage bilaterally.  Orthopedic  No limitations of motion  feet .  No crepitus or effusions noted.  No bony pathology or digital deformities noted.  Skin  normotropic skin with no porokeratosis noted bilaterally.  No signs of infections or ulcers noted.     Onychomycosis  Nails  B/L.  Pain in right toes  Pain in left toes  Debridement of nails both feet followed trimming the nails with dremel tool.    RTC 3 months.   Gardiner Barefoot DPM

## 2022-10-29 ENCOUNTER — Ambulatory Visit (INDEPENDENT_AMBULATORY_CARE_PROVIDER_SITE_OTHER): Payer: Medicare Other | Admitting: Podiatry

## 2022-10-29 ENCOUNTER — Encounter: Payer: Self-pay | Admitting: Podiatry

## 2022-10-29 VITALS — BP 156/94

## 2022-10-29 DIAGNOSIS — B351 Tinea unguium: Secondary | ICD-10-CM | POA: Diagnosis not present

## 2022-10-29 NOTE — Progress Notes (Signed)
This patient presents to the office with chief complaint of long thick painful nails.  Patient says the nails are painful walking and wearing shoes.  This patient is unable to self treat.  This patient is unable to trim his nails since he is unable to reach his nails.  he presents to the office for preventative foot care services.He is accompanied by Andre his caregiver.  General Appearance  Alert, conversant and in no acute stress.  Vascular  Dorsalis pedis and posterior tibial  pulses are palpable  bilaterally.  Capillary return is within normal limits  bilaterally. Temperature is within normal limits  bilaterally.  Neurologic  Senn-Weinstein monofilament wire test within normal limits  bilaterally. Muscle power within normal limits bilaterally.  Nails Thick disfigured discolored nails with subungual debris  from hallux to fifth toes bilaterally. No evidence of bacterial infection or drainage bilaterally.  Orthopedic  No limitations of motion  feet .  No crepitus or effusions noted.  No bony pathology or digital deformities noted.  Skin  normotropic skin with no porokeratosis noted bilaterally.  No signs of infections or ulcers noted.     Onychomycosis  Nails  B/L.  Pain in right toes  Pain in left toes  Debridement of nails both feet followed trimming the nails with dremel tool.    RTC 3 months.   Casidee Jann DPM   

## 2023-01-31 ENCOUNTER — Encounter: Payer: Self-pay | Admitting: Podiatry

## 2023-01-31 ENCOUNTER — Ambulatory Visit (INDEPENDENT_AMBULATORY_CARE_PROVIDER_SITE_OTHER): Payer: Medicare Other | Admitting: Podiatry

## 2023-01-31 DIAGNOSIS — M79674 Pain in right toe(s): Secondary | ICD-10-CM | POA: Diagnosis not present

## 2023-01-31 DIAGNOSIS — M79675 Pain in left toe(s): Secondary | ICD-10-CM | POA: Diagnosis not present

## 2023-01-31 DIAGNOSIS — B351 Tinea unguium: Secondary | ICD-10-CM

## 2023-01-31 NOTE — Progress Notes (Signed)
This patient presents to the office with chief complaint of long thick painful nails.  Patient says the nails are painful walking and wearing shoes.  This patient is unable to self treat.  This patient is unable to trim his nails since he is unable to reach his nails.  he presents to the office for preventative foot care services.He is accompanied by Debby Bud his caregiver.  General Appearance  Alert, conversant and in no acute stress.  Vascular  Dorsalis pedis and posterior tibial  pulses are palpable  bilaterally.  Capillary return is within normal limits  bilaterally. Temperature is within normal limits  bilaterally.  Neurologic  Senn-Weinstein monofilament wire test within normal limits  bilaterally. Muscle power within normal limits bilaterally.  Nails Thick disfigured discolored nails with subungual debris  from hallux to fifth toes bilaterally. No evidence of bacterial infection or drainage bilaterally.  Orthopedic  No limitations of motion  feet .  No crepitus or effusions noted.  No bony pathology or digital deformities noted.  Skin  normotropic skin with no porokeratosis noted bilaterally.  No signs of infections or ulcers noted.     Onychomycosis  Nails  B/L.  Pain in right toes  Pain in left toes  Debridement of nails both feet followed trimming the nails with dremel tool.    RTC 3 months.   Helane Gunther DPM

## 2023-05-16 ENCOUNTER — Ambulatory Visit: Payer: Medicare Other | Admitting: Podiatry

## 2023-05-19 ENCOUNTER — Ambulatory Visit (INDEPENDENT_AMBULATORY_CARE_PROVIDER_SITE_OTHER): Payer: Medicare Other | Admitting: Podiatry

## 2023-05-19 ENCOUNTER — Encounter: Payer: Self-pay | Admitting: Podiatry

## 2023-05-19 DIAGNOSIS — L853 Xerosis cutis: Secondary | ICD-10-CM

## 2023-05-19 DIAGNOSIS — B351 Tinea unguium: Secondary | ICD-10-CM

## 2023-05-19 DIAGNOSIS — M79675 Pain in left toe(s): Secondary | ICD-10-CM

## 2023-05-19 DIAGNOSIS — M79674 Pain in right toe(s): Secondary | ICD-10-CM

## 2023-05-19 NOTE — Progress Notes (Signed)
This patient presents to the office with chief complaint of long thick painful nails.  Patient says the nails are painful walking and wearing shoes.  This patient is unable to self treat.  This patient is unable to trim his nails since he is unable to reach his nails.  he presents to the office for preventative foot care services.He is accompanied by Debby Bud his caregiver.  General Appearance  Alert, conversant and in no acute stress.  Vascular  Dorsalis pedis and posterior tibial  pulses are palpable  bilaterally.  Capillary return is within normal limits  bilaterally. Temperature is within normal limits  bilaterally.  Neurologic  Senn-Weinstein monofilament wire test within normal limits  bilaterally. Muscle power within normal limits bilaterally.  Nails Thick disfigured discolored nails with subungual debris  from hallux to fifth toes bilaterally. No evidence of bacterial infection or drainage bilaterally.  Orthopedic  No limitations of motion  feet .  No crepitus or effusions noted.  No bony pathology or digital deformities noted.  Skin  normotropic skin with no porokeratosis noted bilaterally.  No signs of infections or ulcers noted.     Onychomycosis  Nails  B/L.  Pain in right toes  Pain in left toes  Debridement of nails both feet followed trimming the nails with dremel tool.    RTC 3 months.   Helane Gunther DPM

## 2023-12-05 ENCOUNTER — Ambulatory Visit: Admitting: Podiatry

## 2023-12-14 ENCOUNTER — Ambulatory Visit (INDEPENDENT_AMBULATORY_CARE_PROVIDER_SITE_OTHER): Admitting: Podiatry

## 2023-12-14 ENCOUNTER — Encounter: Payer: Self-pay | Admitting: Podiatry

## 2023-12-14 DIAGNOSIS — M79675 Pain in left toe(s): Secondary | ICD-10-CM

## 2023-12-14 DIAGNOSIS — M79674 Pain in right toe(s): Secondary | ICD-10-CM

## 2023-12-14 DIAGNOSIS — B351 Tinea unguium: Secondary | ICD-10-CM

## 2023-12-14 NOTE — Progress Notes (Signed)
This patient presents to the office with chief complaint of long thick painful nails.  Patient says the nails are painful walking and wearing shoes.  This patient is unable to self treat.  This patient is unable to trim his nails since he is unable to reach his nails.  he presents to the office for preventative foot care services.He is accompanied by Debby Bud his caregiver.  General Appearance  Alert, conversant and in no acute stress.  Vascular  Dorsalis pedis and posterior tibial  pulses are palpable  bilaterally.  Capillary return is within normal limits  bilaterally. Temperature is within normal limits  bilaterally.  Neurologic  Senn-Weinstein monofilament wire test within normal limits  bilaterally. Muscle power within normal limits bilaterally.  Nails Thick disfigured discolored nails with subungual debris  from hallux to fifth toes bilaterally. No evidence of bacterial infection or drainage bilaterally.  Orthopedic  No limitations of motion  feet .  No crepitus or effusions noted.  No bony pathology or digital deformities noted.  Skin  normotropic skin with no porokeratosis noted bilaterally.  No signs of infections or ulcers noted.     Onychomycosis  Nails  B/L.  Pain in right toes  Pain in left toes  Debridement of nails both feet followed trimming the nails with dremel tool.    RTC 3 months.   Helane Gunther DPM

## 2024-01-27 ENCOUNTER — Other Ambulatory Visit: Payer: Self-pay | Admitting: Family Medicine

## 2024-01-27 ENCOUNTER — Ambulatory Visit
Admission: RE | Admit: 2024-01-27 | Discharge: 2024-01-27 | Disposition: A | Discharge status: Home / Self Care | Source: Ambulatory Visit | Attending: Family Medicine | Admitting: Family Medicine

## 2024-01-27 DIAGNOSIS — M109 Gout, unspecified: Secondary | ICD-10-CM

## 2024-02-09 ENCOUNTER — Other Ambulatory Visit: Payer: Self-pay | Admitting: Family Medicine

## 2024-02-09 DIAGNOSIS — E049 Nontoxic goiter, unspecified: Secondary | ICD-10-CM

## 2024-02-09 DIAGNOSIS — E010 Iodine-deficiency related diffuse (endemic) goiter: Secondary | ICD-10-CM

## 2024-02-13 ENCOUNTER — Ambulatory Visit
Admission: RE | Admit: 2024-02-13 | Discharge: 2024-02-13 | Disposition: A | Discharge status: Home / Self Care | Source: Ambulatory Visit | Attending: Family Medicine | Admitting: Family Medicine

## 2024-02-13 DIAGNOSIS — E049 Nontoxic goiter, unspecified: Secondary | ICD-10-CM

## 2024-02-13 DIAGNOSIS — E010 Iodine-deficiency related diffuse (endemic) goiter: Secondary | ICD-10-CM

## 2024-02-25 IMAGING — CR DG CHEST 2V
2 series · 2 of 2 positions shown · non-contrast
Comparison: None.

CLINICAL DATA: Cough and congestion

EXAM:
CHEST - 2 VIEW

[w chest pa]
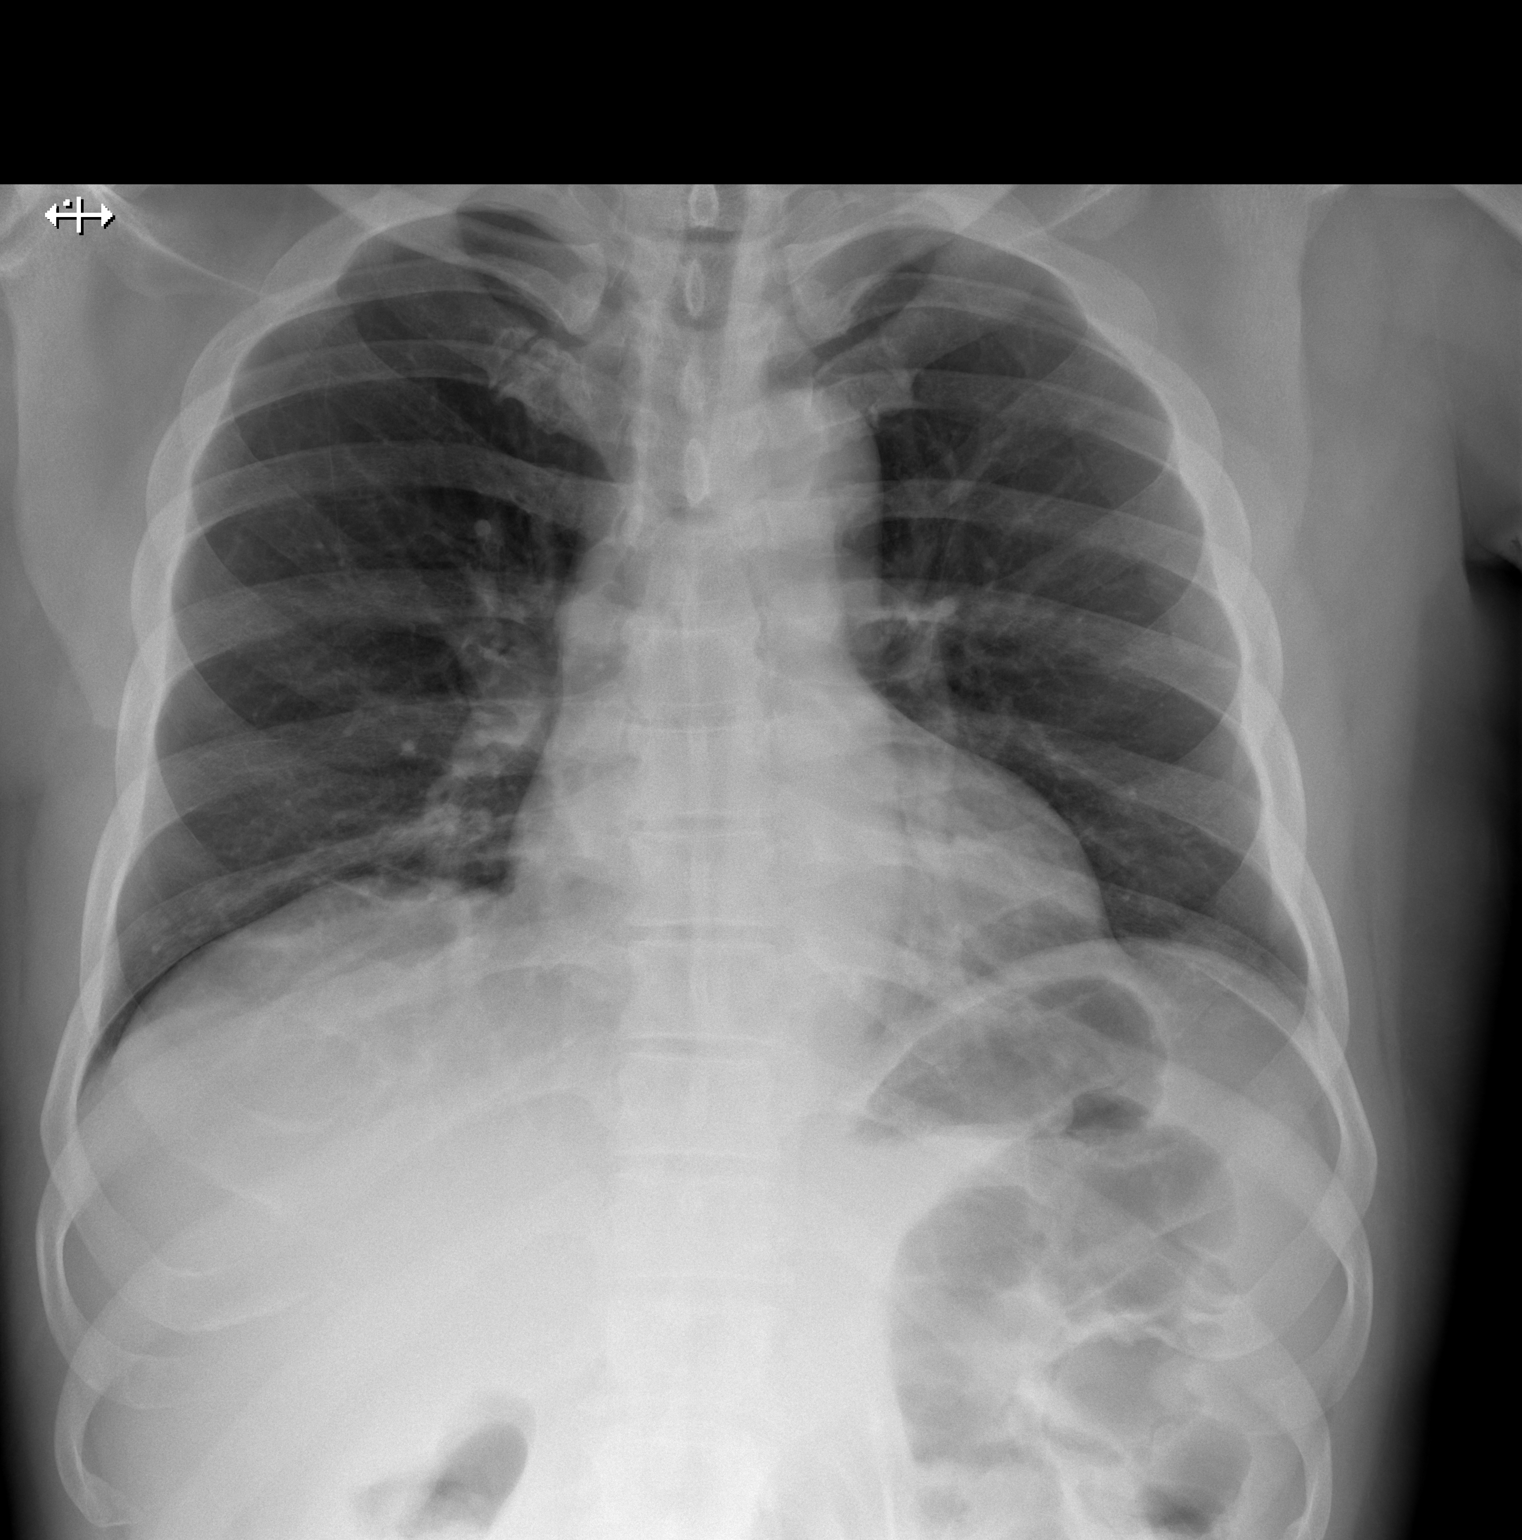

[w chest lat]
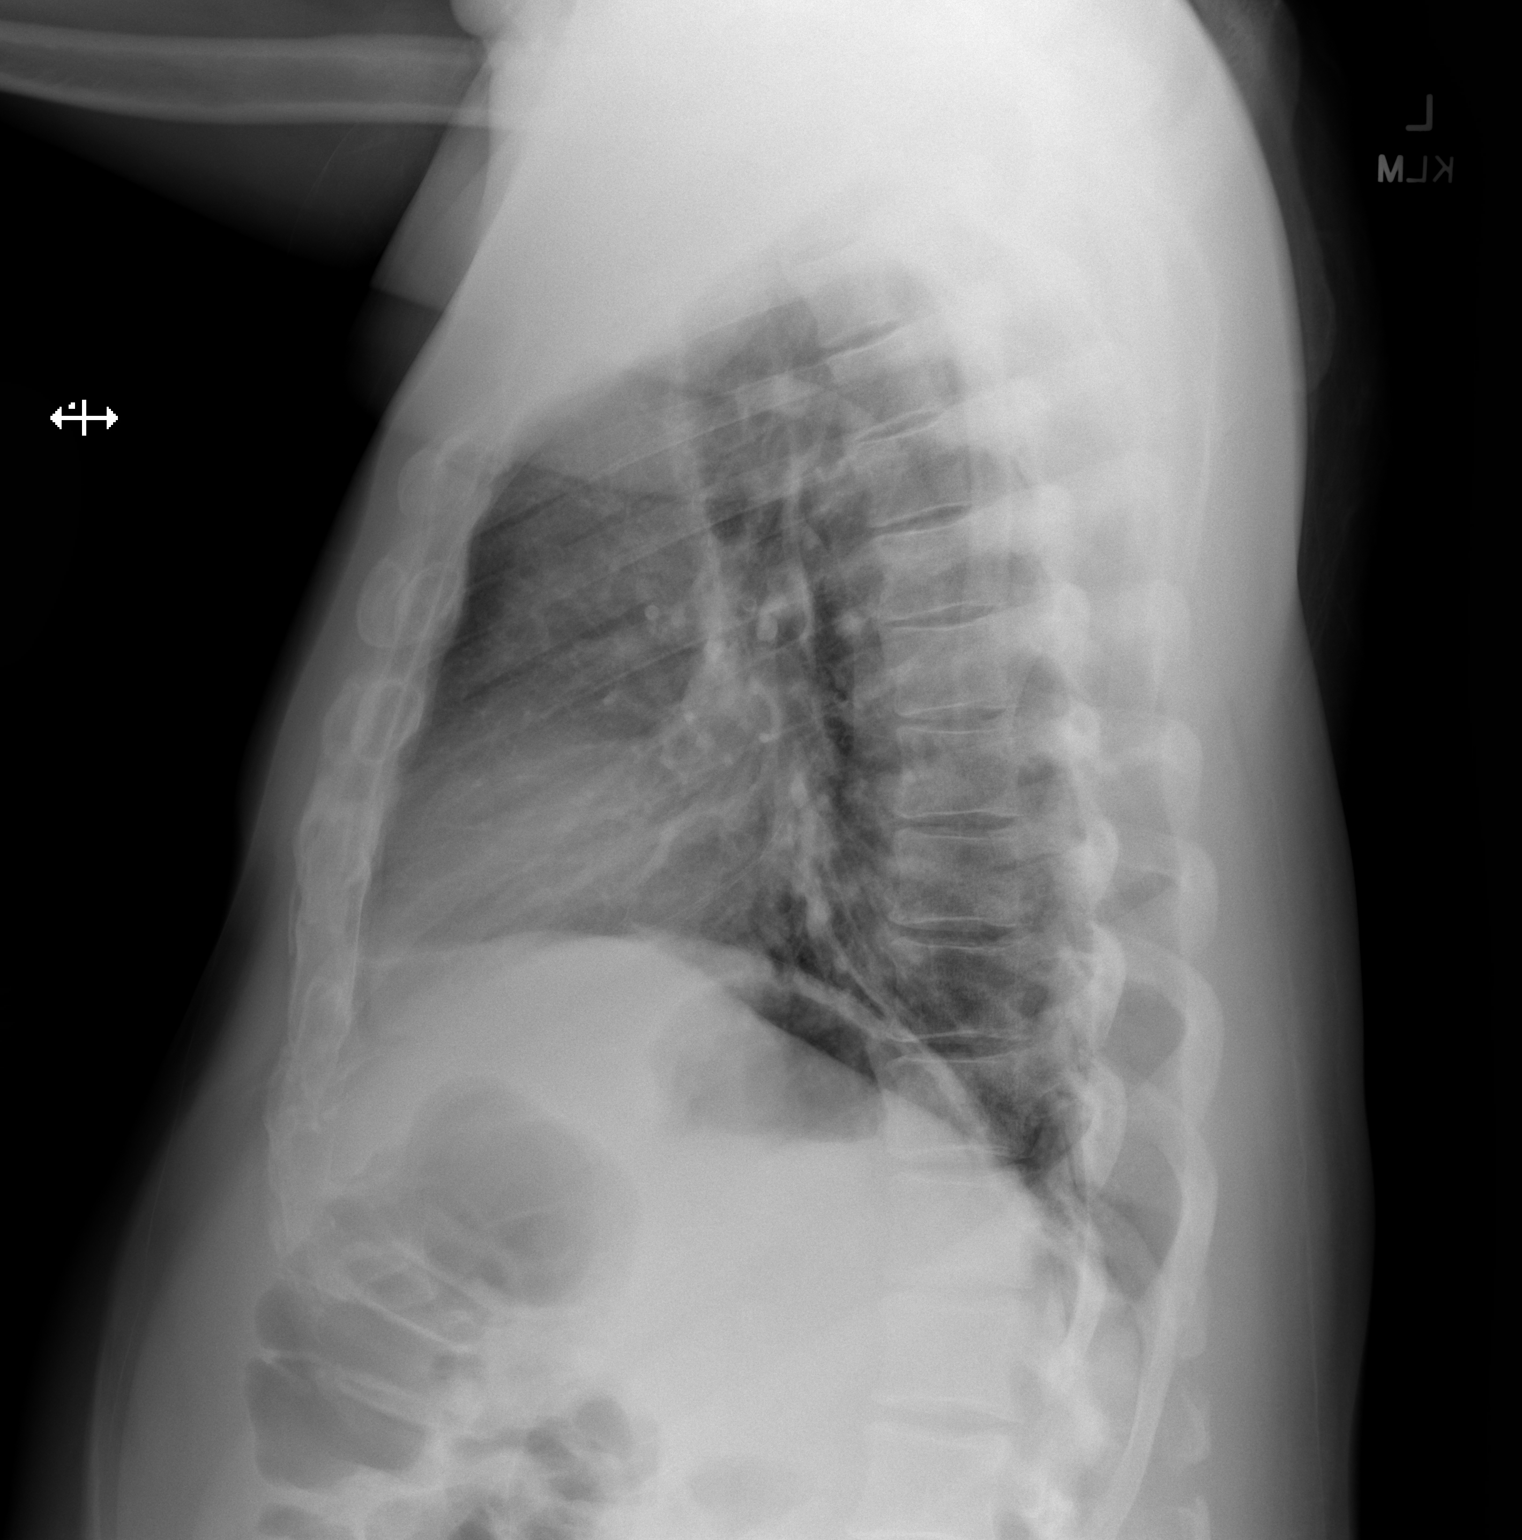

[2 of 2 positions shown; findings below may reference images not displayed]

FINDINGS: The heart size and mediastinal contours are within normal limits.
Both lungs are clear. The visualized skeletal structures are
unremarkable.
IMPRESSION: No active cardiopulmonary disease.

## 2024-03-15 ENCOUNTER — Ambulatory Visit: Admitting: Podiatry
